# Patient Record
Sex: Female | Born: 1960 | Race: White | Hispanic: No | Marital: Married | Smoking: Never smoker
Health system: Southern US, Community
[De-identification: ages and names within clinical notes are randomized; demographics above are authoritative.]

## PROBLEM LIST (undated history)

## (undated) DIAGNOSIS — C50419 Malignant neoplasm of upper-outer quadrant of unspecified female breast: Secondary | ICD-10-CM

## (undated) DIAGNOSIS — I10 Essential (primary) hypertension: Secondary | ICD-10-CM

## (undated) DIAGNOSIS — Z923 Personal history of irradiation: Secondary | ICD-10-CM

## (undated) DIAGNOSIS — R920 Mammographic microcalcification found on diagnostic imaging of breast: Secondary | ICD-10-CM

## (undated) DIAGNOSIS — C50919 Malignant neoplasm of unspecified site of unspecified female breast: Secondary | ICD-10-CM

## (undated) HISTORY — PX: APPENDECTOMY: SHX54

## (undated) HISTORY — DX: Mammographic microcalcification found on diagnostic imaging of breast: R92.0

## (undated) HISTORY — DX: Malignant neoplasm of upper-outer quadrant of unspecified female breast: C50.419

## (undated) HISTORY — DX: Essential (primary) hypertension: I10

---

## 2006-10-03 ENCOUNTER — Ambulatory Visit: Payer: Self-pay | Admitting: Unknown Physician Specialty

## 2007-07-24 ENCOUNTER — Ambulatory Visit: Payer: Self-pay

## 2010-01-19 ENCOUNTER — Ambulatory Visit: Payer: Self-pay | Admitting: Nurse Practitioner

## 2010-12-12 DIAGNOSIS — I10 Essential (primary) hypertension: Secondary | ICD-10-CM

## 2010-12-12 HISTORY — DX: Essential (primary) hypertension: I10

## 2012-12-12 DIAGNOSIS — C50919 Malignant neoplasm of unspecified site of unspecified female breast: Secondary | ICD-10-CM

## 2012-12-12 HISTORY — PX: BREAST LUMPECTOMY: SHX2

## 2012-12-12 HISTORY — DX: Malignant neoplasm of unspecified site of unspecified female breast: C50.919

## 2012-12-14 ENCOUNTER — Ambulatory Visit: Payer: Self-pay | Admitting: Family Medicine

## 2012-12-19 ENCOUNTER — Ambulatory Visit: Payer: Self-pay | Admitting: Family Medicine

## 2012-12-31 DIAGNOSIS — R920 Mammographic microcalcification found on diagnostic imaging of breast: Secondary | ICD-10-CM

## 2012-12-31 HISTORY — DX: Mammographic microcalcification found on diagnostic imaging of breast: R92.0

## 2013-01-03 ENCOUNTER — Ambulatory Visit: Payer: Self-pay | Admitting: General Surgery

## 2013-01-03 HISTORY — PX: BREAST BIOPSY: SHX20

## 2013-01-08 ENCOUNTER — Ambulatory Visit: Payer: Self-pay | Admitting: General Surgery

## 2013-01-08 DIAGNOSIS — I10 Essential (primary) hypertension: Secondary | ICD-10-CM

## 2013-01-08 LAB — BASIC METABOLIC PANEL
Anion Gap: 7 (ref 7–16)
BUN: 15 mg/dL (ref 7–18)
Calcium, Total: 9.5 mg/dL (ref 8.5–10.1)
Chloride: 106 mmol/L (ref 98–107)
EGFR (Non-African Amer.): >60
Glucose: 88 mg/dL (ref 65–99)
Osmolality: 280 (ref 275–301)
Potassium: 3.8 mmol/L (ref 3.5–5.1)

## 2013-01-08 LAB — PREGNANCY, URINE: Pregnancy Test, Urine: NEGATIVE m[IU]/mL

## 2013-01-09 LAB — PATHOLOGY REPORT

## 2013-01-10 ENCOUNTER — Ambulatory Visit: Payer: Self-pay | Admitting: General Surgery

## 2013-01-10 DIAGNOSIS — C50419 Malignant neoplasm of upper-outer quadrant of unspecified female breast: Secondary | ICD-10-CM

## 2013-01-10 HISTORY — PX: BREAST SURGERY: SHX581

## 2013-01-10 HISTORY — DX: Malignant neoplasm of upper-outer quadrant of unspecified female breast: C50.419

## 2013-01-15 HISTORY — PX: BREAST EXCISIONAL BIOPSY: SUR124

## 2013-01-15 LAB — PATHOLOGY REPORT

## 2013-01-25 ENCOUNTER — Ambulatory Visit: Payer: Self-pay | Admitting: Radiation Oncology

## 2013-02-09 ENCOUNTER — Ambulatory Visit: Payer: Self-pay | Admitting: Radiation Oncology

## 2013-03-11 ENCOUNTER — Encounter: Payer: Self-pay | Admitting: *Deleted

## 2013-03-11 DIAGNOSIS — R920 Mammographic microcalcification found on diagnostic imaging of breast: Secondary | ICD-10-CM | POA: Insufficient documentation

## 2013-03-11 DIAGNOSIS — C50419 Malignant neoplasm of upper-outer quadrant of unspecified female breast: Secondary | ICD-10-CM | POA: Insufficient documentation

## 2013-03-12 ENCOUNTER — Ambulatory Visit: Payer: Self-pay | Admitting: Radiation Oncology

## 2013-03-20 ENCOUNTER — Ambulatory Visit (INDEPENDENT_AMBULATORY_CARE_PROVIDER_SITE_OTHER): Payer: 59 | Admitting: General Surgery

## 2013-03-20 ENCOUNTER — Encounter: Payer: Self-pay | Admitting: General Surgery

## 2013-03-20 VITALS — BP 124/70 | HR 80 | Resp 14 | Ht 66.0 in | Wt 223.0 lb

## 2013-03-20 DIAGNOSIS — C50419 Malignant neoplasm of upper-outer quadrant of unspecified female breast: Secondary | ICD-10-CM

## 2013-03-20 DIAGNOSIS — C50411 Malignant neoplasm of upper-outer quadrant of right female breast: Secondary | ICD-10-CM

## 2013-03-20 MED ORDER — TAMOXIFEN CITRATE 20 MG PO TABS: 20.0000 mg | ORAL_TABLET | Freq: Every day | ORAL | Status: DC

## 2013-03-20 NOTE — Progress Notes (Signed)
Patient ID: Alyssa Barr, female   DOB: 1961-12-07, 52 y.o.   MRN: [id number]  Chief Complaint  Patient presents with  . Routine Post Op    right lumpectomy  . Breast Cancer Long Term Follow Up    HPI Alyssa Barr is a 52 y.o. female  Here today for her follow up right lumpectomy. HPI  Past Medical History  Diagnosis Date  . Hypertension 2012  . Mammographic microcalcification 12/31/2012    right breast  . Malignant neoplasm of upper-outer quadrant of female breast 2014    right    Past Surgical History  Procedure Laterality Date  . Breast surgery Right 01/10/2013    wide excision, low grade DCIS, ER/PR positive tumor. Post procedure radiation therapy to start 02/11/13  . Breast biopsy Right 01/03/2013    stereo, low grade DCIS  . Cesarean section  [id number]  . Appendectomy      age of 47   . Exploratory laparotomy      Family History  Problem Relation Age of Onset  . Cancer Maternal Grandmother 80    Blastomycosis of the jaw-late 80's  . Cancer Paternal Grandfather 75    colon cancer in late 59's  . Cancer Cousin 50    breast cancer in late 3's    Social History History  Substance Use Topics  . Smoking status: Never Smoker   . Smokeless tobacco: Never Used  . Alcohol Use: 0.0 oz/week     Comment: ocassionally less than 1 beer weekly    Allergies  Allergen Reactions  . Sulfa Antibiotics Itching    Current Outpatient Prescriptions  Medication Sig Dispense Refill  . B Complex-C (SUPER B COMPLEX/VITAMIN C) TABS Take 1 tablet by mouth daily.      . Black Cohosh 200 MG CAPS Take 1 capsule by mouth 2 (two) times daily.      . Calcium Carbonate-Vitamin D (CALCIUM PLUS VITAMIN D PO) Take 1 tablet by mouth 2 (two) times daily.      . Cholecalciferol (VITAMIN D3) 1000 UNITS CAPS Take 1 capsule by mouth daily.      Marland Kitchen lisinopril-hydrochlorothiazide (PRINZIDE,ZESTORETIC) 20-25 MG per tablet Take 1 tablet by mouth daily.      . niacin 500 MG tablet Take 1,000 mg  by mouth daily.      . Omega-3 Fatty Acids (FISH OIL) 1200 MG CAPS Take 2,400 mg by mouth daily.      . Psyllium (DIETARY FIBER LAXATIVE PO) Take 2 tablets by mouth daily.      . Red Yeast Rice 600 MG TABS Take 2 tablets by mouth daily.       No current facility-administered medications for this visit.    Review of Systems Review of Systems  Constitutional: Negative.   Respiratory: Negative.   Cardiovascular: Negative.     Blood pressure 124/70, pulse 80, resp. rate 14, height 5\' 6"  (1.676 m), weight 223 lb (101.152 kg), last menstrual period 11/30/2012.  Physical Exam Physical Exam  Constitutional: She appears well-developed and well-nourished.  Pulmonary/Chest: Right breast exhibits no inverted nipple, no mass, no nipple discharge, no skin change and no tenderness.  Moderate redness over the treatment field.   Data Reviewed None  Assessment    DCIS right breast.     Plan    Reviewed indications for Tamoxifen treatment. Risks: DVT, vasomotor symptoms and uterine cancer discussed.  Patient will start May 1 after RT completed later this month. To give a phone f/u on  June 1 to report her tolerance of the medication.  Follow up July 2014 with right diagnostic mammogram.       Earline Mayotte 03/20/2013, 9:24 AM

## 2013-03-20 NOTE — Patient Instructions (Addendum)
Talked about tamoxifen with patient.Patient to start taking in on May 1,2014

## 2013-04-11 ENCOUNTER — Ambulatory Visit: Payer: Self-pay | Admitting: Radiation Oncology

## 2013-04-23 ENCOUNTER — Encounter: Payer: Self-pay | Admitting: General Surgery

## 2013-05-12 ENCOUNTER — Ambulatory Visit: Payer: Self-pay | Admitting: Radiation Oncology

## 2013-05-14 ENCOUNTER — Telehealth: Payer: Self-pay | Admitting: *Deleted

## 2013-05-14 NOTE — Telephone Encounter (Signed)
Phone call from patient states she has noticed some bruises on her abdomen (4) and a few on her legs. States "[place] bruise easy anyway[place].  Wonders if this is a side effect of Tamoxifen.  Reviewed side effects with the patient states she will keep an eye on them and see if anymore develop or changes occur, she will let us know.

## 2013-05-26 IMAGING — CT CT SIM MISC
1 series · 16 of 34 positions shown, 20 images · non-contrast
Comparison: none

[Series 2: — · axial · 1.17mm/px · z∈[-702,-369]mm · 16 of 114 slices shown, 20 images]
[im 9/114  mediastinal]
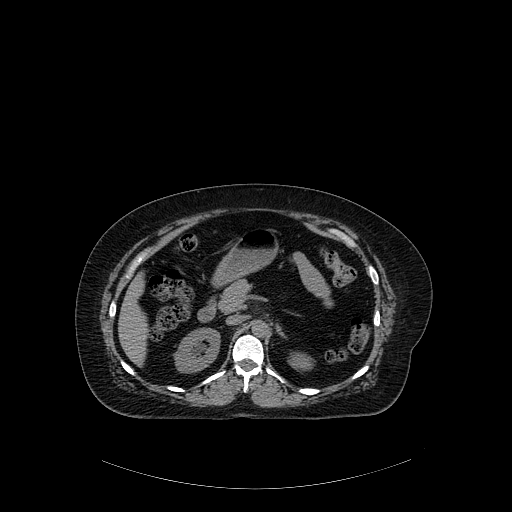
[im 9/114  lung]
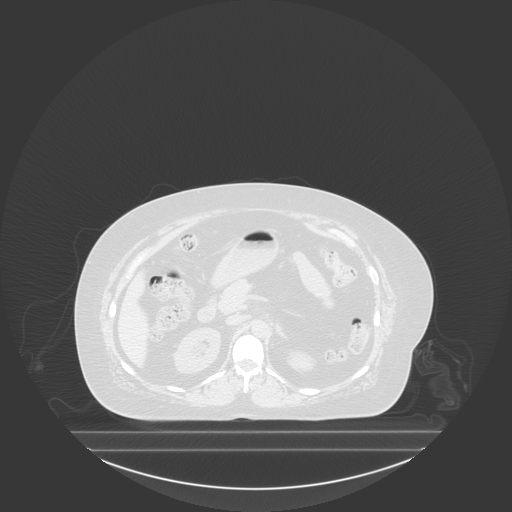
[im 17/114  lung]
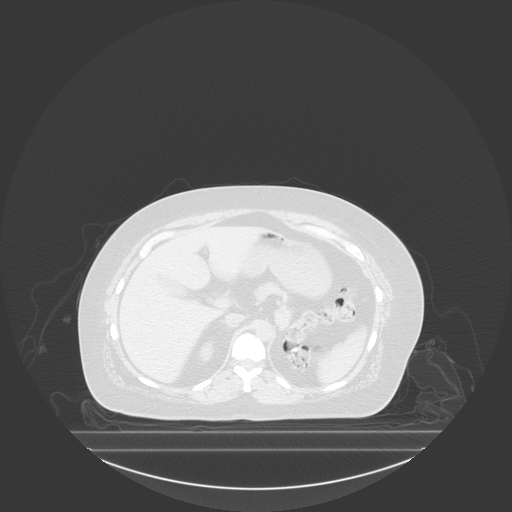
[im 23/114  lung]
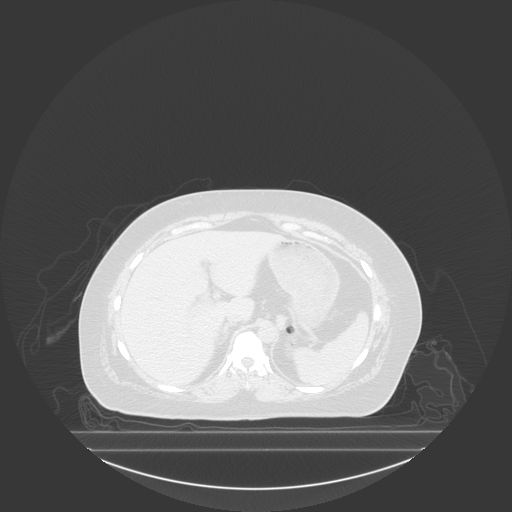
[im 30/114  lung]
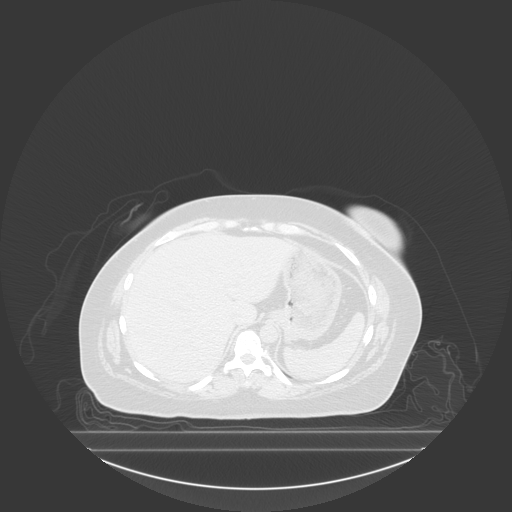
[im 38/114  mediastinal]
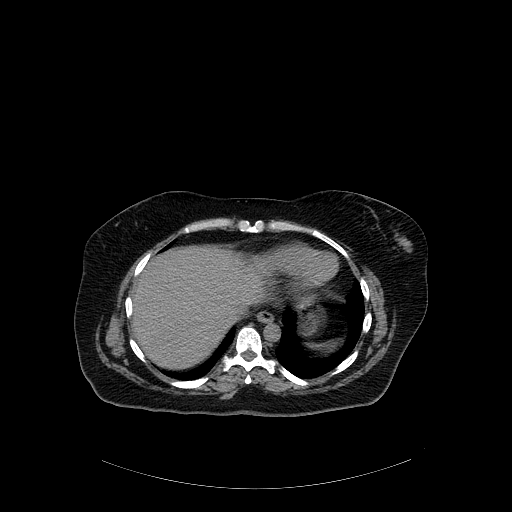
[im 38/114  lung]
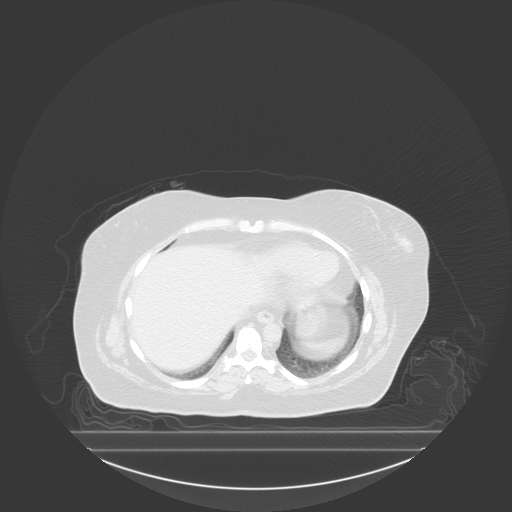
[im 46/114  lung]
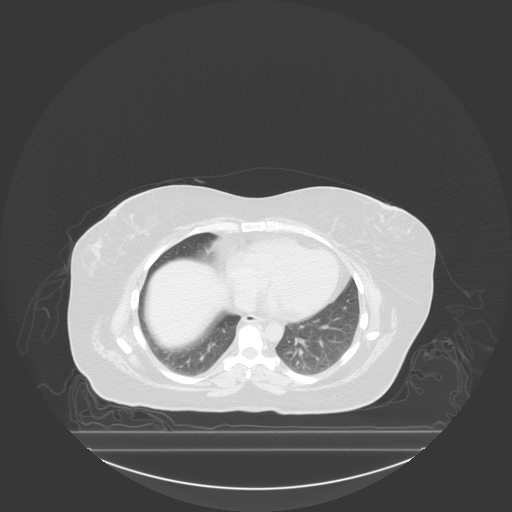
[im 51/114  lung]
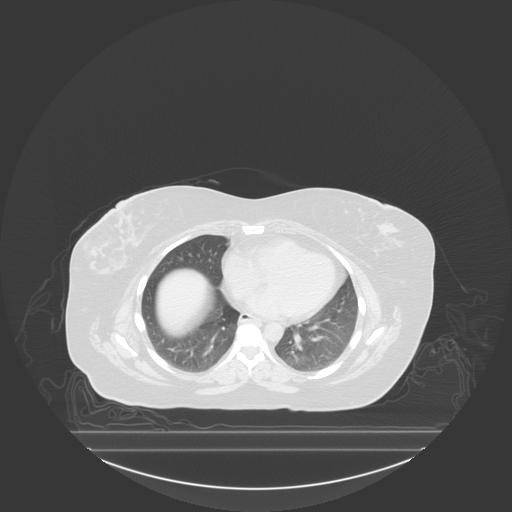
[im 59/114  lung]
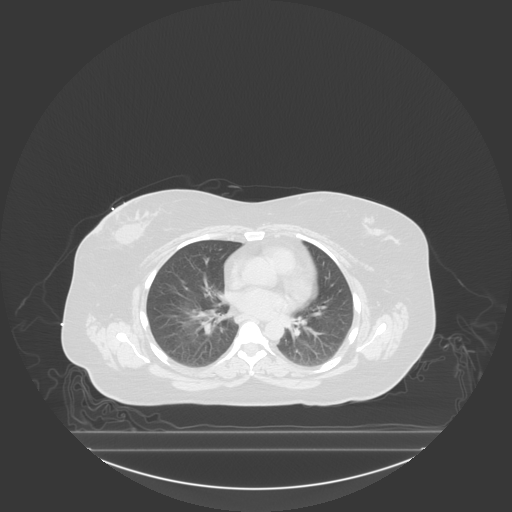
[im 63/114  mediastinal]
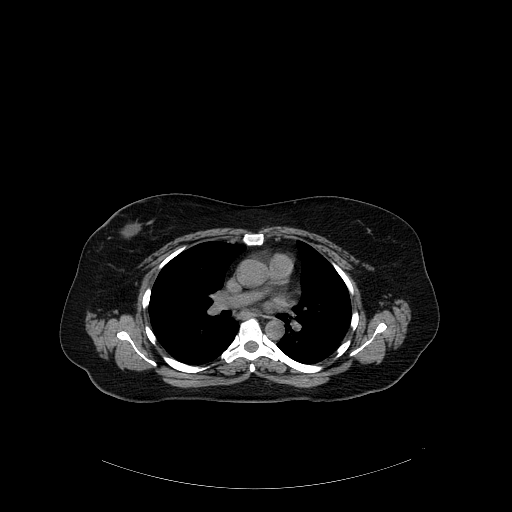
[im 63/114  lung]
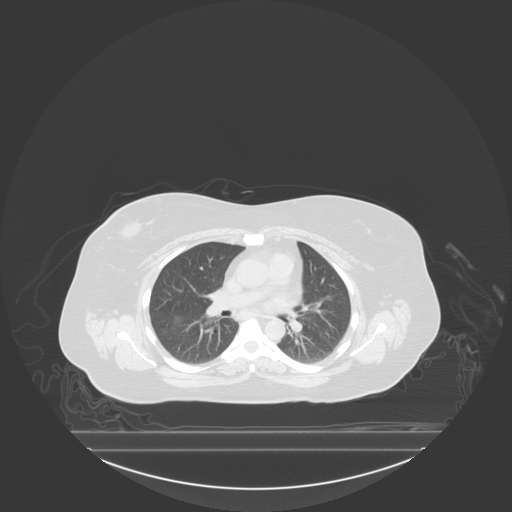
[im 67/114  lung]
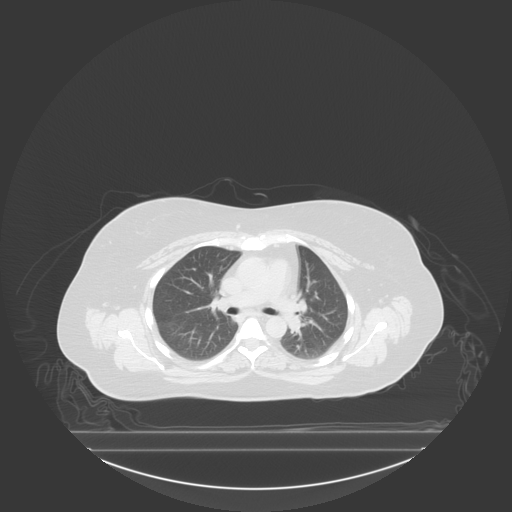
[im 72/114  lung]
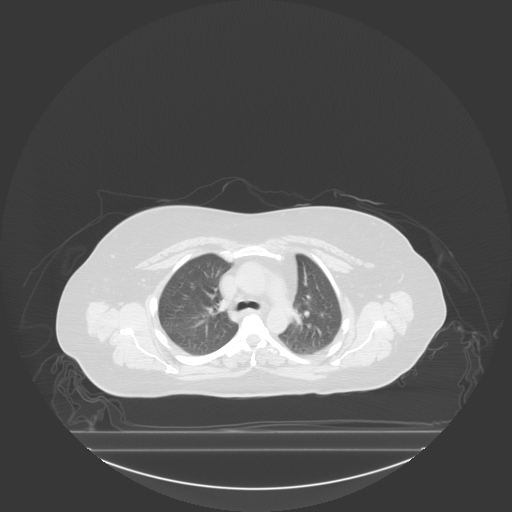
[im 80/114  lung]
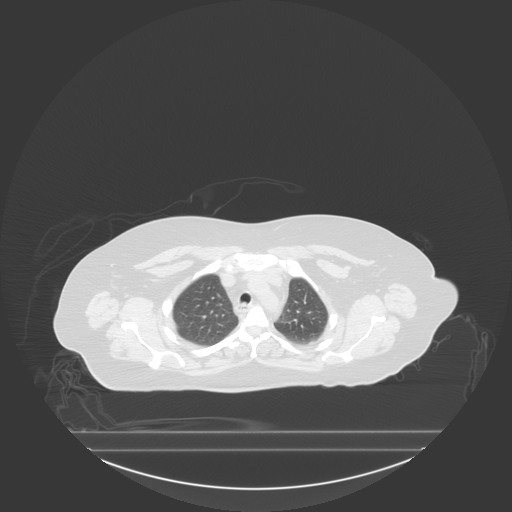
[im 88/114  mediastinal]
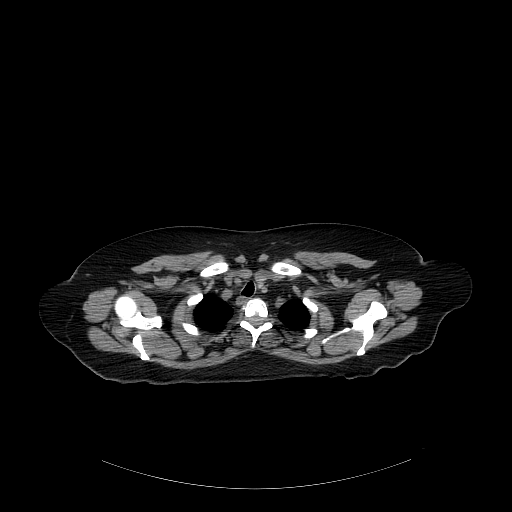
[im 88/114  lung]
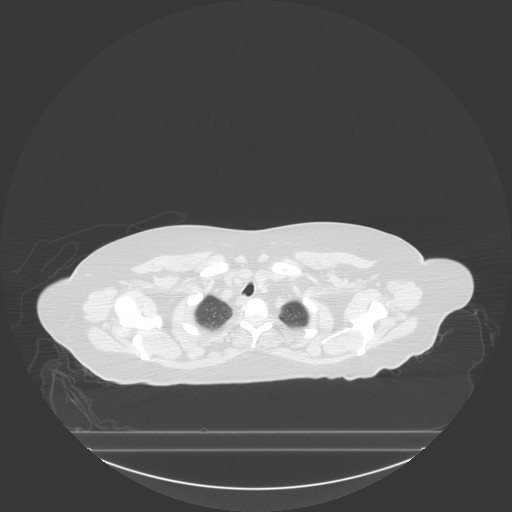
[im 93/114  lung]
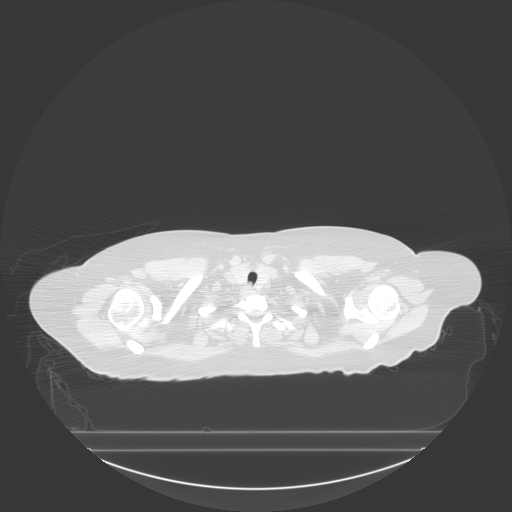
[im 101/114  lung]
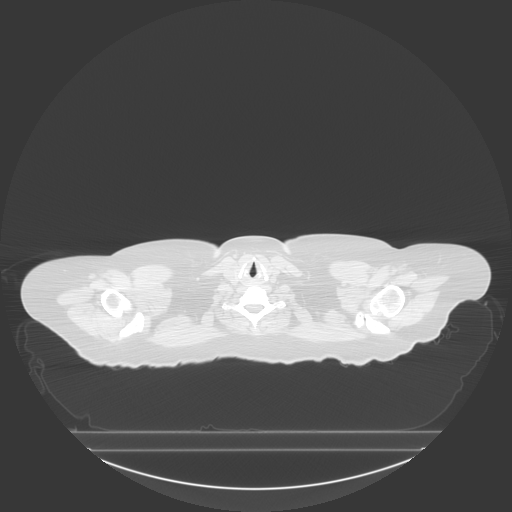
[im 109/114  lung]
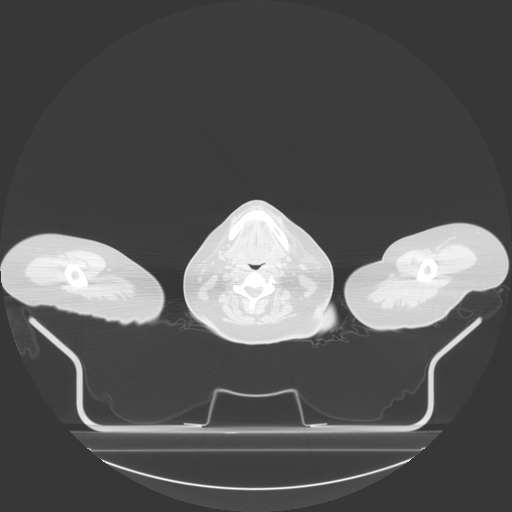

[16 of 34 positions shown; findings below may reference images not displayed]

IMAGES IMPORTED FROM THE SYN[employer] WORKFLOW SYSTEM
NO DICTATION FOR STUDY

## 2013-06-20 ENCOUNTER — Ambulatory Visit: Payer: Self-pay | Admitting: General Surgery

## 2013-06-26 ENCOUNTER — Ambulatory Visit: Payer: Self-pay | Admitting: Radiation Oncology

## 2013-06-26 ENCOUNTER — Ambulatory Visit: Payer: Self-pay | Admitting: General Surgery

## 2013-07-02 ENCOUNTER — Ambulatory Visit: Payer: Self-pay | Admitting: General Surgery

## 2013-07-03 ENCOUNTER — Encounter: Payer: Self-pay | Admitting: General Surgery

## 2013-07-10 ENCOUNTER — Encounter: Payer: Self-pay | Admitting: General Surgery

## 2013-07-10 ENCOUNTER — Ambulatory Visit (INDEPENDENT_AMBULATORY_CARE_PROVIDER_SITE_OTHER): Payer: 59 | Admitting: General Surgery

## 2013-07-10 ENCOUNTER — Other Ambulatory Visit: Payer: Self-pay | Admitting: General Surgery

## 2013-07-10 VITALS — BP 130/78 | HR 74 | Resp 14 | Ht 66.0 in | Wt 222.0 lb

## 2013-07-10 DIAGNOSIS — C50419 Malignant neoplasm of upper-outer quadrant of unspecified female breast: Secondary | ICD-10-CM

## 2013-07-10 DIAGNOSIS — Z1211 Encounter for screening for malignant neoplasm of colon: Secondary | ICD-10-CM

## 2013-07-10 DIAGNOSIS — C50411 Malignant neoplasm of upper-outer quadrant of right female breast: Secondary | ICD-10-CM

## 2013-07-10 MED ORDER — POLYETHYLENE GLYCOL 3350 17 GM/SCOOP PO POWD: ORAL | Status: DC

## 2013-07-10 NOTE — Patient Instructions (Addendum)
Patient to return in six months bilateral diagnotic mammogram. Colonoscopy A colonoscopy is an exam to evaluate your entire colon. In this exam, your colon is cleansed. A long fiberoptic tube is inserted through your rectum and into your colon. The fiberoptic scope (endoscope) is a long bundle of enclosed and very flexible fibers. These fibers transmit light to the area examined and send images from that area to your caregiver. Discomfort is usually minimal. You may be given a drug to help you sleep (sedative) during or prior to the procedure. This exam helps to detect lumps (tumors), polyps, inflammation, and areas of bleeding. Your caregiver may also take a small piece of tissue (biopsy) that will be examined under a microscope. LET YOUR CAREGIVER KNOW ABOUT:   Allergies to food or medicine.  Medicines taken, including vitamins, herbs, eyedrops, over-the-counter medicines, and creams.  Use of steroids (by mouth or creams).  Previous problems with anesthetics or numbing medicines.  History of bleeding problems or blood clots.  Previous surgery.  Other health problems, including diabetes and kidney problems.  Possibility of pregnancy, if this applies. BEFORE THE PROCEDURE   A clear liquid diet may be required for 2 days before the exam.  Ask your caregiver about changing or stopping your regular medications.  Liquid injections (enemas) or laxatives may be required.  A large amount of electrolyte solution may be given to you to drink over a short period of time. This solution is used to clean out your colon.  You should be present 60 minutes prior to your procedure or as directed by your caregiver. AFTER THE PROCEDURE   If you received a sedative or pain relieving medication, you will need to arrange for someone to drive you home.  Occasionally, there is a little blood passed with the first bowel movement. Do not be concerned. FINDING OUT THE RESULTS OF YOUR TEST Not all test  results are available during your visit. If your test results are not back during the visit, make an appointment with your caregiver to find out the results. Do not assume everything is normal if you have not heard from your caregiver or the medical facility. It is important for you to follow up on all of your test results. HOME CARE INSTRUCTIONS   It is not unusual to pass moderate amounts of gas and experience mild abdominal cramping following the procedure. This is due to air being used to inflate your colon during the exam. Walking or a warm pack on your belly (abdomen) may help.  You may resume all normal meals and activities after sedatives and medicines have worn off.  Only take over-the-counter or prescription medicines for pain, discomfort, or fever as directed by your caregiver. Do not use aspirin or blood thinners if a biopsy was taken. Consult your caregiver for medicine usage if biopsies were taken. SEEK IMMEDIATE MEDICAL CARE IF:   You have a fever.  You pass large blood clots or fill a toilet with blood following the procedure. This may also occur 10 to 14 days following the procedure. This is more likely if a biopsy was taken.  You develop abdominal pain that keeps getting worse and cannot be relieved with medicine. Document Released: 11/25/2000 Document Revised: 02/20/2012 Document Reviewed: 07/10/2008 [facility] Patient Information 2014 [place], [place].  Patient has been scheduled for a colonoscopy on 09-03-13 at [facility]. This patient has been asked to discontinue fish oil one week prior to procedure.

## 2013-07-10 NOTE — Progress Notes (Signed)
Patient ID: Alyssa Barr, female   DOB: 26-Feb-1961, 52 y.o.   MRN: [id number]  Chief Complaint  Patient presents with  . Follow-up    mammogram    HPI Alyssa Barr is a 52 y.o. female who presents for a breast evaluation. The most recent mammogram was done on 06/02/13 cat 2. The patient underwent excision of an area of DCIS in January 2014. Nuclear grade 1.   Patient does perform regular self breast checks and gets regular mammograms done.    The patient will be 52 later this year, and brought up the indication for colonoscopy at that time.  HPI  Past Medical History  Diagnosis Date  . Hypertension 2012  . Mammographic microcalcification 12/31/2012    right breast  . Malignant neoplasm of upper-outer quadrant of female breast 2014    right    Past Surgical History  Procedure Laterality Date  . Breast surgery Right 01/10/2013    wide excision, low grade DCIS, ER/PR positive tumor. Post procedure radiation therapy to start 02/11/13  . Breast biopsy Right 01/03/2013    stereo, low grade DCIS  . Cesarean section  [id number]  . Appendectomy      age of 81     Family History  Problem Relation Age of Onset  . Cancer Maternal Grandmother 80    Blastomycosis of the jaw-late 80's  . Cancer Paternal Grandfather 64    colon cancer in late 40's  . Cancer Cousin 50    breast cancer in late 58's    Social History History  Substance Use Topics  . Smoking status: Never Smoker   . Smokeless tobacco: Never Used  . Alcohol Use: 0.0 oz/week     Comment: ocassionally less than 1 beer weekly    Allergies  Allergen Reactions  . Sulfa Antibiotics Itching    Current Outpatient Prescriptions  Medication Sig Dispense Refill  . B Complex-C (SUPER B COMPLEX/VITAMIN C) TABS Take 1 tablet by mouth daily.      . Calcium Carbonate-Vitamin D (CALCIUM PLUS VITAMIN D PO) Take 1 tablet by mouth 2 (two) times daily.      . Cholecalciferol (VITAMIN D3) 1000 UNITS CAPS Take 1 capsule by  mouth daily.      Marland Kitchen lisinopril-hydrochlorothiazide (PRINZIDE,ZESTORETIC) 20-25 MG per tablet Take 1 tablet by mouth daily.      . niacin 500 MG tablet Take 1,000 mg by mouth daily.      . Omega-3 Fatty Acids (FISH OIL) 1200 MG CAPS Take 2,400 mg by mouth daily.      . Psyllium (DIETARY FIBER LAXATIVE PO) Take 2 tablets by mouth daily.      . Red Yeast Rice 600 MG TABS Take 2 tablets by mouth daily.      . tamoxifen (NOLVADEX) 20 MG tablet Take 1 tablet (20 mg total) by mouth daily.  30 tablet  11  . polyethylene glycol powder (GLYCOLAX/MIRALAX) powder 255 grams one bottle for colonoscopy prep  255 g  0   No current facility-administered medications for this visit.    Review of Systems Review of Systems  Constitutional: Negative.   Respiratory: Negative.   Cardiovascular: Negative.     Blood pressure 130/78, pulse 74, resp. rate 14, height 5\' 6"  (1.676 m), weight 222 lb (100.699 kg).  Physical Exam Physical Exam  Constitutional: She is oriented to person, place, and time. She appears well-developed and well-nourished.  Cardiovascular: Normal rate, regular rhythm and normal heart sounds.   Pulmonary/Chest:  Breath sounds normal. Right breast exhibits no inverted nipple, no mass, no nipple discharge, no skin change and no tenderness. Left breast exhibits no inverted nipple, no mass, no nipple discharge, no skin change and no tenderness.  faint redness over the right lumpectomy site. Well healed scar at 12 o'clock.  Lymphadenopathy:    She has no cervical adenopathy.    She has no axillary adenopathy.  Neurological: She is alert and oriented to person, place, and time.  Skin: Skin is warm and dry.    Data Reviewed Right breast mammogram dated July 02, 2013 shows no residual microcalcifications status post wide excision. BI-RAD-2.  Assessment    DCIS right breast, tolerating tamoxifen therapy fairly well.     Plan    Bilateral diagnostic mammograms will be obtained in 6  months.  Arrangements were made for a colonoscopy later this fall.     Patient has been scheduled for a colonoscopy on 09-03-13 at [facility]. This patient has been asked to discontinue fish oil one week prior to procedure.   Alyssa Barr 07/10/2013, 9:45 PM

## 2013-07-12 ENCOUNTER — Ambulatory Visit: Payer: Self-pay | Admitting: Radiation Oncology

## 2013-07-24 ENCOUNTER — Emergency Department: Payer: Self-pay | Admitting: Unknown Physician Specialty

## 2013-09-02 ENCOUNTER — Telehealth: Payer: Self-pay | Admitting: *Deleted

## 2013-09-02 NOTE — Telephone Encounter (Signed)
Message for patient to call the office. She is scheduled for a colonoscopy tomorrow, 09-03-13.

## 2013-09-02 NOTE — Telephone Encounter (Signed)
Patient reports she is on the same medications since last office visit. We will proceed with colonoscopy that is scheduled for 09-03-13 at [facility].

## 2013-09-03 ENCOUNTER — Ambulatory Visit: Payer: Self-pay | Admitting: General Surgery

## 2013-09-03 DIAGNOSIS — Z1211 Encounter for screening for malignant neoplasm of colon: Secondary | ICD-10-CM

## 2013-09-03 HISTORY — PX: COLONOSCOPY: SHX174

## 2013-09-04 ENCOUNTER — Encounter: Payer: Self-pay | Admitting: General Surgery

## 2013-09-18 ENCOUNTER — Encounter: Payer: Self-pay | Admitting: General Surgery

## 2013-10-02 ENCOUNTER — Ambulatory Visit: Payer: Self-pay | Admitting: Radiation Oncology

## 2013-10-12 ENCOUNTER — Ambulatory Visit: Payer: Self-pay | Admitting: Radiation Oncology

## 2013-12-24 ENCOUNTER — Telehealth: Payer: Self-pay

## 2013-12-24 NOTE — Telephone Encounter (Signed)
Will arrange for pelvic ultrasound for assessment of endometrial lining. F/u after exam.

## 2013-12-24 NOTE — Telephone Encounter (Signed)
I.Spoke with the patient regarding her reported vaginal bleeding.We'll arrange for a Vaginal ultrasound to assess endometrial lining thickening.  The patient has to be contacted on her cell.

## 2013-12-24 NOTE — Telephone Encounter (Signed)
Has not had a period since 11/2012, was started on Tamoxifen in May 2014. She started bleeding today and was concerned if this was normal or not.

## 2013-12-25 ENCOUNTER — Ambulatory Visit: Payer: Self-pay | Admitting: General Surgery

## 2013-12-25 ENCOUNTER — Encounter: Payer: Self-pay | Admitting: General Surgery

## 2013-12-25 NOTE — Telephone Encounter (Signed)
Patient has been scheduled for a vaginal ultrasound at [facility] for 12-27-13 at 8:30 am (arrive 8:15 am). Prep: drink 32 oz. water one hour prior to exam, must be finished with liquid 30 minutes prior, and do not void. She is aware of all instructions.  Patient will follow up in the office as scheduled.

## 2013-12-27 ENCOUNTER — Ambulatory Visit: Payer: Self-pay | Admitting: General Surgery

## 2013-12-27 ENCOUNTER — Encounter: Payer: Self-pay | Admitting: General Surgery

## 2013-12-30 ENCOUNTER — Telehealth: Payer: Self-pay

## 2013-12-30 ENCOUNTER — Other Ambulatory Visit: Payer: Self-pay

## 2013-12-30 DIAGNOSIS — N939 Abnormal uterine and vaginal bleeding, unspecified: Secondary | ICD-10-CM

## 2013-12-30 NOTE — Telephone Encounter (Signed)
Spoke with patient and reviewed results. She is scheduled to see Dr Kenton Kingfisher at [place] at the [facility] center in [place] on 01/07/14 at 9:20 am. Patient is aware of date and time. She will call back with any questions. All records have been faxed to [facility].

## 2013-12-30 NOTE — Telephone Encounter (Signed)
Message copied by Lesly Rubenstein on Mon Dec 30, 2013  2:55 PM ------      Message from: Rupert, [place] W      Created: Mon Dec 30, 2013 12:28 PM       Please notify the patient that the vaginal ultrasound did show some thickening of the lining of the uterus.  It will be appropriate for her to be seen by GYN and have a biopsy. Stop Tamoxifen for the present.      See who she would like to see, if no preference, arrange for early referral to [facility]. Thanks.      ----- Message -----         From: Darrin Nipper, CMA         Sent: 12/27/2013   9:48 AM           To: Robert Bellow, MD                   ------

## 2014-01-07 HISTORY — PX: ENDOMETRIAL BIOPSY: SHX622

## 2014-01-08 ENCOUNTER — Encounter: Payer: Self-pay | Admitting: General Surgery

## 2014-01-08 ENCOUNTER — Ambulatory Visit (INDEPENDENT_AMBULATORY_CARE_PROVIDER_SITE_OTHER): Payer: 59 | Admitting: General Surgery

## 2014-01-08 VITALS — BP 154/80 | HR 82 | Resp 12 | Ht 66.0 in | Wt 234.0 lb

## 2014-01-08 DIAGNOSIS — D0510 Intraductal carcinoma in situ of unspecified breast: Secondary | ICD-10-CM

## 2014-01-08 DIAGNOSIS — D0590 Unspecified type of carcinoma in situ of unspecified breast: Secondary | ICD-10-CM

## 2014-01-08 NOTE — Progress Notes (Signed)
Patient ID: Percell Barr, female   DOB: 14-Mar-1961, 53 y.o.   MRN: [id number]  Chief Complaint  Patient presents with  . Follow-up    mammogram    HPI Alyssa Barr is a 53 y.o. female who presents for a breast evaluation. The most recent mammogram was done on 12/30/13.Patient does perform regular self breast checks and gets regular mammograms done. Patient had an vaginal ultrasound and biospy at [facility] on 01/07/14 completed for postmenopausal bleeding and an ultrasound showing endometrial thickening at 13 mm. Patient has stop taking her Tamoxifen pending her GYN evaluation.Marland Kitchen    HPI  Past Medical History  Diagnosis Date  . Hypertension 2012  . Mammographic microcalcification 12/31/2012    right breast  . 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 Breast surgery Right 01/10/2013    wide excision, low grade DCIS, ER/PR positive tumor. Post procedure radiation therapy to start 02/11/13  . Breast biopsy Right 01/03/2013    stereo, low grade DCIS  . Cesarean section  [id number]  . Appendectomy      age of 70   . Endometrial biopsy  01/07/2014    Alyssa Barr, M.D. for endometrial prominence and postmenopausal bleeding.    Family History  Problem Relation Age of Onset  . Cancer Maternal Grandmother 80    Blastomycosis of the jaw-late 80's  . Cancer Paternal Grandfather 53    colon cancer in late 59's  . Cancer Cousin 50    breast cancer in late 55's    Social History History  Substance Use Topics  . Smoking status: Never Smoker   . Smokeless tobacco: Never Used  . Alcohol Use: 0.0 oz/week     Comment: ocassionally less than 1 beer weekly    Allergies  Allergen Reactions  . Sulfa Antibiotics Itching    Current Outpatient Prescriptions  Medication Sig Dispense Refill  . B Complex-C (SUPER B  COMPLEX/VITAMIN C) TABS Take 1 tablet by mouth daily.      . Calcium Carbonate-Vitamin D (CALCIUM PLUS VITAMIN D PO) Take 1 tablet by mouth 2 (two) times daily.      . Cholecalciferol (VITAMIN D3) 1000 UNITS CAPS Take 1 capsule by mouth daily.      Marland Kitchen lisinopril-hydrochlorothiazide (PRINZIDE,ZESTORETIC) 20-25 MG per tablet Take 1 tablet by mouth daily.      . niacin 500 MG tablet Take 1,000 mg by mouth daily.      . Omega-3 Fatty Acids (FISH OIL) 1200 MG CAPS Take 2,400 mg by mouth daily.      . polyethylene glycol powder (GLYCOLAX/MIRALAX) powder 255 grams one bottle for colonoscopy prep  255 g  0  . Psyllium (DIETARY FIBER LAXATIVE PO) Take 2 tablets by mouth daily.      . Red Yeast Rice 600 MG TABS Take 2 tablets by mouth daily.       No current facility-administered medications for this visit.    Review of Systems Review of Systems  Constitutional: Negative.   Respiratory: Negative.   Cardiovascular: Negative.     Blood pressure 154/80, pulse 82, resp. rate 12, height 5\' 6"  (1.676 m), weight 234 lb (106.142 kg).  Physical Exam Physical Exam  Constitutional: She is oriented to person, place, and time. She appears well-developed and well-nourished.  Eyes: Conjunctivae are normal.  Neck:  Neck supple.  Cardiovascular: Normal rate, regular rhythm and normal heart sounds.   Pulmonary/Chest: Effort normal and breath sounds normal. Right breast exhibits no inverted nipple, no mass, no nipple discharge, no skin change and no tenderness. Left breast exhibits no inverted nipple, no mass, no nipple discharge, no skin change and no tenderness.  Well healed incision right breast .   Lymphadenopathy:    She has no cervical adenopathy.    She has no axillary adenopathy.  Neurological: She is alert and oriented to person, place, and time.  Skin: Skin is warm and dry.    Data Reviewed Bilateral diagnostic mammograms dated December 25, 2013 were reviewed. Minimal scarring.  BI-RAD-2.  Assessment    Low-grade DCIS.  Postmenopausal bleeding with endometrial thickening on ultrasound, biopsy results pending.    Plan    The patient will hold the use of tamoxifen pending her endometrial biopsy results. It's unlikely secondary to tamoxifen therapy at this early stage. If hyperplasia is identified, will need to consider changing to [place]. If atypia is identified she'll likely be a candidate for a D&C versus TAH/BSO.  In regards to her breasts will arrange for followup diagnostic mammogram, bilateral in one year.       Robert Bellow 01/08/2014, 9:20 AM

## 2014-01-08 NOTE — Patient Instructions (Signed)
Patient to return in one year bilateral diagnotic mammogram.  

## 2014-01-11 ENCOUNTER — Encounter: Payer: Self-pay | Admitting: General Surgery

## 2014-01-11 NOTE — Progress Notes (Signed)
Patient ID: Alyssa Barr, female   DOB: [birth year], 53 y.o.   MRN: [id number]   The patient underwent endometrial biopsy by Barnett Applebaum, M.D. after she developed vaginal bleeding one year at the initiation of tamoxifen therapy.  Pathology showed a benign endometrial polyp and proliferative phase endometrium. No evidence of hyperplasia or malignancy. Benign endocervical tissue. This does not represent a contraindication to continue tamoxifen therapy.  A prescription for tamoxifen, 20 mg daily will be sent to her mail-order pharmacy.  Follow up as previously scheduled.

## 2014-01-14 ENCOUNTER — Telehealth: Payer: Self-pay

## 2014-01-14 ENCOUNTER — Other Ambulatory Visit: Payer: Self-pay

## 2014-01-14 DIAGNOSIS — D0510 Intraductal carcinoma in situ of unspecified breast: Secondary | ICD-10-CM

## 2014-01-14 MED ORDER — TAMOXIFEN CITRATE 20 MG PO TABS: 20.0000 mg | ORAL_TABLET | Freq: Every day | ORAL | Status: DC

## 2014-01-14 NOTE — Telephone Encounter (Signed)
Message copied by Lesly Rubenstein on Tue Jan 14, 2014 11:16 AM ------      Message from: Robert Bellow      Created: Sat Jan 11, 2014  8:22 AM       Caryl-Lyn:  Mail order pharmacy is not in the system. Please arrange for a prescription for tamoxifen, 20 mg, one by mouth daily, #90 with 3 refills. [facility]. Thanks. ------

## 2014-01-14 NOTE — Telephone Encounter (Signed)
Patient notified to restart Tamoxifen. Prescription sent to [facility] for mail order delivery service.

## 2014-05-21 ENCOUNTER — Telehealth: Payer: Self-pay | Admitting: General Surgery

## 2014-05-21 NOTE — Telephone Encounter (Signed)
PLEASE CALL IN SCRIPT FOR TAMOXIFEN TO [facility]. SHE NORMALLY GETS MEDS FROM MAIL IN PHARMACY BUT THE ARE TEMPORARILY  OUT.

## 2014-05-22 MED ORDER — TAMOXIFEN CITRATE 20 MG PO TABS: 20.0000 mg | ORAL_TABLET | Freq: Every day | ORAL | Status: DC

## 2014-05-22 NOTE — Telephone Encounter (Signed)
Send RX Tamoxifen, 20 mg, # 30, 11 refills to requested pharmacy. Thank.s

## 2014-05-22 NOTE — Addendum Note (Signed)
Addended by: Carson Myrtle on: 05/22/2014 08:46 AM   Modules accepted: Orders

## 2014-05-22 NOTE — Telephone Encounter (Signed)
RX sent

## 2014-10-01 ENCOUNTER — Ambulatory Visit: Payer: Self-pay | Admitting: Radiation Oncology

## 2014-10-12 ENCOUNTER — Ambulatory Visit: Payer: Self-pay | Admitting: Radiation Oncology

## 2014-10-13 ENCOUNTER — Encounter: Payer: Self-pay | Admitting: General Surgery

## 2015-01-07 ENCOUNTER — Encounter: Payer: Self-pay | Admitting: General Surgery

## 2015-01-08 ENCOUNTER — Ambulatory Visit (INDEPENDENT_AMBULATORY_CARE_PROVIDER_SITE_OTHER): Payer: 59 | Admitting: General Surgery

## 2015-01-08 ENCOUNTER — Encounter: Payer: Self-pay | Admitting: General Surgery

## 2015-01-08 VITALS — BP 130/68 | HR 86 | Resp 14 | Ht 66.0 in | Wt 235.0 lb

## 2015-01-08 DIAGNOSIS — D0511 Intraductal carcinoma in situ of right breast: Secondary | ICD-10-CM

## 2015-01-08 NOTE — Progress Notes (Signed)
Patient ID: Alyssa Barr, female   DOB: 04-03-1961, 54 y.o.   MRN: [id number]  Chief Complaint  Patient presents with  . Follow-up    mammogram    HPI Alyssa Barr is a 54 y.o. female who presents for a breast evaluation. The most recent mammogram was done on 01/02/15 .  Patient does perform regular self breast checks and gets regular mammograms done.    The patient was concerned about her weight. She reports she walks one to 2 miles per day to brisk pace and has been careful about her diet. She is 81 pounds since her last visit here.  HPI  Past Medical History  Diagnosis Date  . Hypertension 2012  . Mammographic microcalcification 12/31/2012    right breast  . 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 Breast surgery Right 01/10/2013    wide excision, low grade DCIS, ER/PR positive tumor. Post procedure radiation therapy to start 02/11/13  . Breast biopsy Right 01/03/2013    stereo, low grade DCIS  . Cesarean section  [id number]  . Appendectomy      age of 58   . Endometrial biopsy  01/07/2014    Alyssa Barr, M.D. for endometrial prominence and postmenopausal bleeding.  . Colonoscopy  09/03/2013    Aletha Allebach    Family History  Problem Relation Age of Onset  . Cancer Maternal Grandmother 80    Blastomycosis of the jaw-late 80's  . Cancer Paternal Grandfather 63    colon cancer in late 29's  . Cancer Cousin 50    breast cancer in late 59's    Social History History  Substance Use Topics  . Smoking status: Never Smoker   . Smokeless tobacco: Never Used  . Alcohol Use: 0.0 oz/week     Comment: ocassionally less than 1 beer weekly    Allergies  Allergen Reactions  . Sulfa Antibiotics Itching    Current Outpatient Prescriptions  Medication Sig Dispense Refill  . Cholecalciferol (VITAMIN D3)  1000 UNITS CAPS Take 1 capsule by mouth daily.    Marland Kitchen lisinopril-hydrochlorothiazide (PRINZIDE,ZESTORETIC) 20-25 MG per tablet Take 1 tablet by mouth daily.    . tamoxifen (NOLVADEX) 20 MG tablet Take 1 tablet (20 mg total) by mouth daily. 90 tablet 3   No current facility-administered medications for this visit.    Review of Systems Review of Systems  Constitutional: Negative.   Respiratory: Negative.   Cardiovascular: Negative.     Blood pressure 130/68, pulse 86, resp. rate 14, height 5\' 6"  (1.676 m), weight 235 lb (106.595 kg).  Physical Exam Physical Exam  Constitutional: She is oriented to person, place, and time. She appears well-developed and well-nourished.  Eyes: Conjunctivae are normal. No scleral icterus.  Neck: Neck supple.  Cardiovascular: Normal rate, regular rhythm and normal heart sounds.   Pulmonary/Chest: Effort normal and breath sounds normal. Right breast exhibits no inverted nipple, no mass, no nipple discharge, no skin change and no tenderness. Left breast exhibits no inverted nipple, no mass, no nipple discharge, no skin change and no tenderness.    Good symmetry area  Abdominal: Soft. Normal appearance and bowel sounds are normal. There is no hepatomegaly. There is no tenderness. No hernia.  Lymphadenopathy:    She has no cervical adenopathy.    She has no axillary adenopathy.  Neurological: She is alert and oriented to person, place, and time.  Skin: Skin is warm and dry.    Data Reviewed Bilateral diagnostic mammograms completed 01/06/2015 at [facility] showed no interval change. Postsurgical scarring noted. BI-RADS-2.  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    Plan    The patient has been asked to return to the office in one year with a bilateral diagnostic mammogram.    PCP:  Virgie Dad 01/08/2015, 12:21 PM

## 2015-01-08 NOTE — Patient Instructions (Signed)
The patient has been asked to return to the office in one year with a bilateral diagnostic mammogram. 

## 2015-02-09 ENCOUNTER — Other Ambulatory Visit: Payer: Self-pay | Admitting: General Surgery

## 2015-04-03 NOTE — Op Note (Signed)
PATIENT NAME:  Alyssa Barr, Alyssa Barr MR#:  [id number] DATE OF BIRTH:  [birth year]  DATE OF PROCEDURE:  01/10/2013  PREOPERATIVE DIAGNOSIS: DCIS of the right breast.  POSTOPERATIVE DIAGNOSIS:  DCIS of the right breast.  OPERATIVE PROCEDURE: Wide local excision.   SURGEON: Hervey Ard, MD.   ANESTHESIA: General by LMA under Dr. Andree Elk, Marcaine 0.5% with 1:200,000 units epinephrine, 30 mL local infiltration.   ESTIMATED BLOOD LOSS: Minimal.   CLINICAL NOTE: This 54 year old woman had a mammogram showing evidence of a new cluster of microcalcifications. Stereotactic biopsy showed evidence of low grade DCIS. She was felt to be a candidate for wide local excision and post procedure radiation therapy for management.   OPERATIVE NOTE: With the patient under adequate general anesthesia, the area was prepped with ChloraPrep and draped. She received Kefzol prior to the procedure. Ultrasound was used to identify the previous biopsy cavity. Review of her pre-biopsy mammogram showed a secondary cluster of microcalcifications superior and medial to the original and it was elected to encompass this in the wide excision. A radial incision at the 9 o'clock location of the breast was extended from the 9 to 12 o'clock position in a circumareolar pattern to encompass an area approximately 4 x 4 x 5 cm. The skin was incised sharply and the remaining dissection completed with electrocautery. The breast flaps, approximately 1 cm in thickness, were elevated to the level of the breast parenchyma and then extended down to, but not including the pectoralis fascia. The specimen was orientated and specimen radiograph confirmed the previous biopsy clip and the secondary cluster of calcifications. It was sent fresh to pathology for margin inking.   The area showed good hemostasis. The wound was closed in multiple layers with 2-0 Vicryl figure-of-eight sutures. The skin was closed with a running 4-0 Vicryl subcuticular suture.  Benzoin and Steri-Strips were applied followed by Telfa pad. Fluff gauze, Kerlix, and Ace wrap was then applied. The patient tolerated the procedure well and was taken to the recovery room in stable condition.  ____________________________ Alyssa Bellow, MD jwb:aw D: 01/10/2013 08:55:39 ET T: 01/10/2013 10:25:06 ET JOB#: [id number]  cc: Alyssa Bellow, MD, <Dictator> Irven Easterly. Kary Kos, MD Stylianos Stradling Amedeo Kinsman MD ELECTRONICALLY SIGNED 01/10/2013 20:25

## 2015-04-03 NOTE — Consult Note (Signed)
Reason for Visit: This 54 year old Female patient presents to the clinic for initial evaluation of  breast cancer .   Referred by Dr. Hervey Ard.  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 Tollie Pizza. Pathology was positive for ductal carcinoma in situ. Underwent wide local excision for a 2 cm mass of DCIS strongly ER/PR positive. It was micropapillary type grade 1. Margins were clear but close at 1 mm. Patient tolerated her surgery well. She seen today and doing fine with no complaints. I been asked to evaluate the patient for possible adjuvant radiation therapy to her right breast.  Past, Family and Social History:  Past Medical History positive   Family History positive   Family History Comments family history positive for colon cancer, breast cancer,adult onset diabetes and hypertension.   Social History positive   Social History Comments no smoking history social EtOH use history.   Additional Past Medical and Surgical History seen by herself today.   Allergies:   Sulfa drugs: Swelling, Itching  Home Meds:  Home Medications: Medication Instructions Status  hydrochlorothiazide-lisinopril 25 mg-20 mg oral tablet 1 tab(s) orally once a day Active   Review of Systems:  General negative   Performance Status (ECOG) 0   Skin negative   Breast see HPI   Ophthalmologic  negative   ENMT negative   Respiratory and Thorax negative   Cardiovascular negative   Gastrointestinal negative   Genitourinary negative   Musculoskeletal negative   Neurological negative   Psychiatric negative   Hematology/Lymphatics negative   Endocrine negative   Allergic/Immunologic negative   Review of Systems   according to the nurse's notesPatient denies any weight loss, fatigue, weakness, fever, chills or night sweats. Patient denies any loss of vision, blurred vision. Patient denies any ringing  of the ears or hearing loss. No irregular heartbeat. Patient denies heart murmur or history of fainting. Patient denies any chest pain or pain radiating to her upper extremities. Patient denies any shortness of breath, difficulty breathing at night, cough or hemoptysis. Patient denies any swelling in the lower legs. Patient denies any nausea vomiting, vomiting of blood, or coffee ground material in the vomitus. Patient denies any stomach pain. Patient states has had normal bowel movements no significant constipation or diarrhea. Patient denies any dysuria, hematuria or significant nocturia. Patient denies any problems walking, swelling in the joints or loss of balance. Patient denies any skin changes, loss of hair or loss of weight. Patient denies any excessive worrying or anxiety or significant depression. Patient denies any problems with insomnia. Patient denies excessive thirst, polyuria, polydipsia. Patient denies any swollen glands, patient denies easy bruising or easy bleeding. Patient denies any recent infections, allergies or URI. Patient "s visual fields have not changed significantly in recent time.  Nursing Notes:  Nursing Vital Signs and Chemo Nursing Nursing Notes: *CC Vital Signs Flowsheet:   14-Feb-14 09:05  Vital Signs Type Vital Signs Type Routine  Temp Temperature 97.7  Respirations Respirations 18  SBP SBP 148  DBP DBP 92  Pain Scale (0-10)  0    09:40  Temp  Temperature 97.7  Pulse Pulse 98  Respirations Respirations 18  SBP SBP 148  DBP DBP 92   Physical Exam:  General/Skin/HEENT:  General normal   Skin normal   Eyes normal   ENMT normal   Head and Neck normal   Additional PE well-developed well-nourished female in NAD. Lungs are clear to A&P cardiac examination shows regular rate and rhythm. She status post wide local excision of the right breast. Incision is healed well. There some slight nodularity surrounding the incision site secondary to lumpectomy cavity from this. No other dominant mass or nodularity is noted ineither breast into position examined. Abdomen is benign with no organomegaly or masses noted.   Breasts/Resp/CV/GI/GU:  Respiratory and Thorax normal   Cardiovascular normal   Gastrointestinal normal   Genitourinary normal   MS/Neuro/Psych/Lymph:  Musculoskeletal normal   Neurological normal   Psychiatric normal   Lymphatics normal   Other Results:  Radiology Results: LabUnknown:    08-Jan-14 09:08, Digital Additional Views Rt Breast (SCR)  PACS Image   [facility]:  Digital Additional Views Rt Breast (SCR)   REASON FOR EXAM:    av rt microcals  COMMENTS:       PROCEDURE: MAM - MAM DIG ADDVIEWS RT SCR  - Dec 19 2012  9:08AM     RESULT: Additional views of the right breast obtained. Noted is a   prominent region containing multiple microcalcifications in the upper   outer retroareolar portion of the right breast. On mediolateral   magnification spot films some of these calcifications appear may   represent milk of calcium cysts as they layer. Small well-circumscribed   benign in appearance calcifications are noted as well . However given the   large number microcalcifications and possible mild pleomorphism ,   surgical evaluation recommended as malignancy cannot be excluded.   Surgical biopsy or stereotactic biopsy is highly recommended.  IMPRESSION:  Prominent regional clustered  microcalcifications upper outer   retroareolar portion right breast for which surgical evaluation is   suggested. These may be malignant microcalcifications.    BI-RADS: Category 4 - Suspicious Abnormality - Biopsy Should Be Considered      Thank you for the oppurtunity to contribute to the care of your patient.        Verified By: Osa Craver, M.D., MD   Relevent Results:   Relevant Scans and Labs mammograms are reviewed.   Assessment and Plan: Impression:   stage 0 ductal carcinoma in situ ER/PR positive and 54 year old female status post wide local excision of the right breast. Plan:   at this time I got over treatment options for adjuvant radiation therapy including accelerated partial breast irradiation versus whole breast radiation. Based on her age and ductal carcinoma in situ status she would be a caution her a candidate for partial breast radiation although we'll proceed. Patient based on her second shift working at [employer] is reluctant to go through twice a day treatment and would prefer whole breast radiation. I believe that is a good decision I have set her up for CT simulation next week. Risks and benefits of treatment including redness of the skin, fatigue, inclusion of some superficial lung, or were explained in detail to the patient. She seems to comprehend my treatment plan well. Patient will also be a candidate for tamoxifen therapy  after completion of radiation.  I would like to take this opportunity to thank you for allowing me to continue to participate in this patient's care.  CC Referral:  cc: Dr. Hervey Ard, Dr. Maryland Pink   Electronic Signatures: Baruch Gouty, Roda Shutters (MD)  (Signed 14-Feb-14 11:37)  Authored: HPI, Diagnosis, PFSH, Allergies, Home Meds, ROS, Nursing Notes, Physical Exam, Other Results, Relevent Results, Encounter Assessment and Plan, CC Referring Physician   Last Updated: 14-Feb-14 11:37 by Armstead Peaks (MD)

## 2015-05-23 ENCOUNTER — Other Ambulatory Visit: Payer: Self-pay | Admitting: General Surgery

## 2015-10-01 ENCOUNTER — Ambulatory Visit: Payer: Self-pay | Admitting: Radiation Oncology

## 2015-10-29 ENCOUNTER — Other Ambulatory Visit: Payer: Self-pay | Admitting: *Deleted

## 2015-10-29 DIAGNOSIS — Z853 Personal history of malignant neoplasm of breast: Secondary | ICD-10-CM

## 2015-11-18 ENCOUNTER — Other Ambulatory Visit: Payer: Self-pay | Admitting: General Surgery

## 2015-11-19 ENCOUNTER — Ambulatory Visit: Payer: Self-pay | Admitting: Radiation Oncology

## 2015-12-17 ENCOUNTER — Ambulatory Visit
Admission: RE | Admit: 2015-12-17 | Discharge: 2015-12-17 | Disposition: A | Discharge status: Home / Self Care | Payer: Managed Care, Other (non HMO) | Source: Ambulatory Visit | Attending: Radiation Oncology | Admitting: Radiation Oncology

## 2015-12-17 ENCOUNTER — Encounter: Payer: Self-pay | Admitting: Radiation Oncology

## 2015-12-17 VITALS — BP 142/90 | HR 83 | Temp 96.3°F | Resp 18 | Wt 225.5 lb

## 2015-12-17 DIAGNOSIS — D0511 Intraductal carcinoma in situ of right breast: Secondary | ICD-10-CM

## 2015-12-17 NOTE — Progress Notes (Signed)
Radiation Oncology Follow up Note  Name: Alyssa Barr   Date:   12/17/2015 MRN:  [id number] DOB: June 15, 1961    This 55 y.o. female presents to the clinic today for follow-up for ductal carcinoma in situ of the right breast status post wide local excision and adjuvant whole breast radiation.  REFERRING PROVIDER: Maryland Pink, MD  HPI: Patient is a 55 year old female now out 2-1/2 years having completed whole breast radiation therapy for ductal carcinoma in situ ER/PR positive. She is currently on tamoxifen Tylenol and that well although it states it makes her quite emotional. She specifically denies breast tenderness cough or bone pain. Follow-up mammograms been fine she scheduled for another one in the next couple of months.. She specifically denies breast tenderness cough or bone pain.  COMPLICATIONS OF TREATMENT: none  FOLLOW UP COMPLIANCE: keeps appointments   PHYSICAL EXAM:  BP 142/90 mmHg  Pulse 83  Temp(Src) 96.3 F (35.7 C)  Resp 18  Wt 225 lb 8.5 oz (102.3 kg) Lungs are clear to A&P cardiac examination essentially unremarkable with regular rate and rhythm. No dominant mass or nodularity is noted in either breast in 2 positions examined. Incision is well-healed. No axillary or supraclavicular adenopathy is appreciated. Cosmetic result is excellent. Well-developed well-nourished patient in NAD. HEENT reveals PERLA, EOMI, discs not visualized.  Oral cavity is clear. No oral mucosal lesions are identified. Neck is clear without evidence of cervical or supraclavicular adenopathy. Lungs are clear to A&P. Cardiac examination is essentially unremarkable with regular rate and rhythm without murmur rub or thrill. Abdomen is benign with no organomegaly or masses noted. Motor sensory and DTR levels are equal and symmetric in the upper and lower extremities. Cranial nerves II through XII are grossly intact. Proprioception is intact. No peripheral adenopathy or edema is identified. No motor  or sensory levels are noted. Crude visual fields are within normal range.  RADIOLOGY RESULTS: Mammograms performed 1 year prior showed no evidence of disease and were reviewed  PLAN: Present time she continues to do well with no evidence of disease. She'll have follow-up mammograms this month which ordered been ordered. Otherwise I'm please were overall progress. I've asked her about possibly Effexor with her easy emotional lability and she adamantly refuses that. She continues on tamoxifen therapy. I've asked to see her back in 1 year for follow-up. Patient is to call sooner with any concerns.  I would like to take this opportunity for allowing me to participate in the care of your patient.Armstead Peaks., MD

## 2015-12-28 ENCOUNTER — Other Ambulatory Visit: Payer: Self-pay

## 2015-12-28 ENCOUNTER — Ambulatory Visit: Payer: Self-pay

## 2016-01-04 ENCOUNTER — Ambulatory Visit: Payer: 59 | Admitting: General Surgery

## 2016-01-11 ENCOUNTER — Ambulatory Visit
Admission: RE | Admit: 2016-01-11 | Discharge: 2016-01-11 | Disposition: A | Discharge status: Home / Self Care | Payer: Managed Care, Other (non HMO) | Source: Ambulatory Visit | Attending: General Surgery | Admitting: General Surgery

## 2016-01-11 ENCOUNTER — Other Ambulatory Visit: Payer: Self-pay | Admitting: General Surgery

## 2016-01-11 DIAGNOSIS — Z853 Personal history of malignant neoplasm of breast: Secondary | ICD-10-CM

## 2016-01-11 HISTORY — DX: Malignant neoplasm of unspecified site of unspecified female breast: C50.919

## 2016-01-14 ENCOUNTER — Ambulatory Visit (INDEPENDENT_AMBULATORY_CARE_PROVIDER_SITE_OTHER): Payer: Managed Care, Other (non HMO) | Admitting: General Surgery

## 2016-01-14 ENCOUNTER — Encounter: Payer: Self-pay | Admitting: General Surgery

## 2016-01-14 VITALS — BP 136/84 | HR 82 | Resp 14 | Ht 66.0 in | Wt 224.0 lb

## 2016-01-14 DIAGNOSIS — D0511 Intraductal carcinoma in situ of right breast: Secondary | ICD-10-CM | POA: Diagnosis not present

## 2016-01-14 NOTE — Progress Notes (Signed)
Patient ID: [name], female   DOB: 1961-04-30, 55 y.o.   MRN: [id number]  Chief Complaint  Patient presents with  . Follow-up    mammogram    HPI Alyssa Barr is a 55 y.o. female.  who presents for her breast cancer follow up and a breast evaluation. The most recent mammogram was done on 01/11/2016.  Patient does perform regular self breast checks and gets regular mammograms done.  No new breast issues. She states she is tolerating the Tamoxifen well.  I personally reviewed the patient's history.   HPI  Past Medical History  Diagnosis Date  . Hypertension 2012  . Mammographic microcalcification 12/31/2012    right breast  . 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 Breast cancer [facility]) 2014    radiation    Past Surgical History  Procedure Laterality Date  . Breast surgery Right 01/10/2013    wide excision, low grade DCIS, ER/PR positive tumor. Post procedure radiation therapy to start 02/11/13  . Cesarean section  [id number]  . Appendectomy      age of 54   . Endometrial biopsy  01/07/2014    Barnett Applebaum, M.D. for endometrial prominence and postmenopausal bleeding.  . Colonoscopy  09/03/2013    Jeorge Reister  . Breast biopsy Right 01/03/2013    stereo, low grade DCIS  . Breast excisional biopsy Right 01/15/13    positive    Family History  Problem Relation Age of Onset  . Cancer Maternal Grandmother 80    Blastomycosis of the jaw-late 80's  . Cancer Paternal Grandfather 4    colon cancer in late 39's  . Cancer Cousin 50    breast cancer in late 26's  . Breast cancer Cousin     Social History Social History  Substance Use Topics  . Smoking status: Never Smoker   . Smokeless tobacco: Never Used  . Alcohol Use: 0.0 oz/week     Comment: ocassionally less than 1 beer weekly    Allergies  Allergen Reactions  . Sulfa Antibiotics  Itching    Current Outpatient Prescriptions  Medication Sig Dispense Refill  . b complex vitamins tablet Take 1 tablet by mouth daily.    . Cholecalciferol (VITAMIN D3) 1000 UNITS CAPS Take 2 capsules by mouth daily.     Marland Kitchen lisinopril-hydrochlorothiazide (PRINZIDE,ZESTORETIC) 20-25 MG per tablet Take 1 tablet by mouth daily.    . tamoxifen (NOLVADEX) 20 MG tablet Take 1 tablet by mouth  every day 90 tablet 3   No current facility-administered medications for this visit.    Review of Systems Review of Systems  Constitutional: Negative.   Respiratory: Negative.   Cardiovascular: Negative.     Blood pressure 136/84, pulse 82, resp. rate 14, height 5\' 6"  (1.676 m), weight 224 lb (101.606 kg), last menstrual period 11/30/2012.  Physical Exam Physical Exam  Constitutional: She is oriented to person, place, and time. She appears well-developed and well-nourished.  HENT:  Mouth/Throat: Oropharynx is clear and moist.  Eyes: Conjunctivae are normal. No scleral icterus.  Neck: Neck supple.  Cardiovascular: Normal rate, regular rhythm and normal heart sounds.   Pulmonary/Chest: Effort normal and breath sounds normal. Right breast exhibits no inverted nipple, no mass, no nipple discharge, no skin change and no tenderness. Left breast exhibits no inverted nipple, no mass, no nipple discharge, no skin change and no tenderness.  Well healed incision right breast.  Lymphadenopathy:    She has no cervical adenopathy.  Neurological: She is alert and oriented to person, place, and time.  Skin: Skin is warm and dry.  Psychiatric: Her behavior is normal.    Data Reviewed Mammograms of 01/11/2016 were independently reviewed. Postsurgical/radiation changes noted in the right breast. BI-RADS-2.  Assessment    Doing well now almost 3 years status post management of low-grade DCIS of the right breast. Good tolerance of tamoxifen.    Plan    The patient reports that she will be following up with  Dr. Kenton Kingfisher from GYN regarding a previously noted endometrial thickening.  Endometrial biopsy completed in January 2015 was negative for malignancy.    The patient has been asked to return to the office in one year with a bilateral diagnostic mammogram  PCP:  [place] Pink This information has been scribed by Karie Fetch RNBC.    Robert Bellow 01/15/2016, 1:24 PM

## 2016-01-14 NOTE — Patient Instructions (Addendum)
The patient is aware to call back for any questions or concerns. Continue self breast exams. Call office for any new breast issues or concerns. The patient has been asked to return to the office in one year with a bilateral diagnostic mammogram. 

## 2016-01-15 DIAGNOSIS — D0510 Intraductal carcinoma in situ of unspecified breast: Secondary | ICD-10-CM | POA: Insufficient documentation

## 2016-04-09 ENCOUNTER — Emergency Department: Payer: Managed Care, Other (non HMO)

## 2016-04-09 ENCOUNTER — Emergency Department
Admission: EM | Admit: 2016-04-09 | Discharge: 2016-04-10 | Disposition: A | Discharge status: Home / Self Care | Payer: Managed Care, Other (non HMO) | Attending: Emergency Medicine | Admitting: Emergency Medicine

## 2016-04-09 ENCOUNTER — Encounter: Payer: Self-pay | Admitting: Emergency Medicine

## 2016-04-09 DIAGNOSIS — Y929 Unspecified place or not applicable: Secondary | ICD-10-CM | POA: Diagnosis not present

## 2016-04-09 DIAGNOSIS — Y999 Unspecified external cause status: Secondary | ICD-10-CM | POA: Diagnosis not present

## 2016-04-09 DIAGNOSIS — Y939 Activity, unspecified: Secondary | ICD-10-CM | POA: Diagnosis not present

## 2016-04-09 DIAGNOSIS — C50411 Malignant neoplasm of upper-outer quadrant of right female breast: Secondary | ICD-10-CM | POA: Diagnosis not present

## 2016-04-09 DIAGNOSIS — S0990XA Unspecified injury of head, initial encounter: Secondary | ICD-10-CM

## 2016-04-09 DIAGNOSIS — W2201XA Walked into wall, initial encounter: Secondary | ICD-10-CM | POA: Insufficient documentation

## 2016-04-09 DIAGNOSIS — Z79899 Other long term (current) drug therapy: Secondary | ICD-10-CM | POA: Insufficient documentation

## 2016-04-09 DIAGNOSIS — S060X0A Concussion without loss of consciousness, initial encounter: Secondary | ICD-10-CM | POA: Diagnosis not present

## 2016-04-09 DIAGNOSIS — I10 Essential (primary) hypertension: Secondary | ICD-10-CM | POA: Insufficient documentation

## 2016-04-09 LAB — CBC WITH DIFFERENTIAL/PLATELET
Basophils Absolute: 0 10*3/uL (ref 0–0.1)
Basophils Relative: 1 %
Eosinophils Absolute: 0.1 10*3/uL (ref 0–0.7)
Eosinophils Relative: 1 %
HEMATOCRIT: 34.5 % — AB (ref 35.0–47.0)
HEMOGLOBIN: 11.6 g/dL — AB (ref 12.0–16.0)
LYMPHS ABS: 1.3 10*3/uL (ref 1.0–3.6)
LYMPHS PCT: 22 %
MCH: 31.7 pg (ref 26.0–34.0)
MCHC: 33.5 g/dL (ref 32.0–36.0)
MCV: 94.6 fL (ref 80.0–100.0)
MONOS PCT: 8 %
Monocytes Absolute: 0.5 10*3/uL (ref 0.2–0.9)
NEUTROS PCT: 68 %
Neutro Abs: 4.1 10*3/uL (ref 1.4–6.5)
Platelets: 199 10*3/uL (ref 150–440)
RBC: 3.65 MIL/uL — AB (ref 3.80–5.20)
RDW: 13.8 % (ref 11.5–14.5)
WBC: 6 10*3/uL (ref 3.6–11.0)

## 2016-04-09 LAB — BASIC METABOLIC PANEL
Anion gap: 9 (ref 5–15)
BUN: 17 mg/dL (ref 6–20)
CHLORIDE: 105 mmol/L (ref 101–111)
CO2: 23 mmol/L (ref 22–32)
Calcium: 8.7 mg/dL — ABNORMAL LOW (ref 8.9–10.3)
Creatinine, Ser: 0.76 mg/dL (ref 0.44–1.00)
GFR calc Af Amer: >60 mL/min (ref >60)
GFR calc non Af Amer: >60 mL/min (ref >60)
GLUCOSE: 110 mg/dL — AB (ref 65–99)
POTASSIUM: 3.8 mmol/L (ref 3.5–5.1)
Sodium: 137 mmol/L (ref 135–145)

## 2016-04-09 MED ORDER — MORPHINE SULFATE (PF) 2 MG/ML IV SOLN
2.0000 mg | Freq: Once | INTRAVENOUS | Status: AC
  Administered 2016-04-09: 2 mg via INTRAVENOUS
  Filled 2016-04-09: qty 1

## 2016-04-09 MED ORDER — SODIUM CHLORIDE 0.9 % IV BOLUS (SEPSIS)
1000.0000 mL | Freq: Once | INTRAVENOUS | Status: AC
  Administered 2016-04-10: 1000 mL via INTRAVENOUS

## 2016-04-09 MED ORDER — ONDANSETRON HCL 4 MG/2ML IJ SOLN
4.0000 mg | Freq: Once | INTRAMUSCULAR | Status: AC
  Administered 2016-04-09: 4 mg via INTRAVENOUS
  Filled 2016-04-09: qty 2

## 2016-04-09 NOTE — ED Provider Notes (Signed)
[facility] Emergency Department Provider Note   ____________________________________________  Time seen: Approximately 11:12 PM  I have reviewed the triage vital signs and the nursing notes.   HISTORY  Chief Complaint Head Injury    HPI Alyssa Barr is a 55 y.o. female who presents to the ED from home with a chief complaint of head injury. Patient reports falling out of her truck at 8 AM this morning, striking her posterior head against a brick wall. Denies LOC but states she was dazed. She attended an outdoor wedding this afternoon and reports nausea, vomiting x 4 and dizziness since.Complains of generalized headache, persistent dizziness and nausea. Denies associated neck pain, vision changes, numbness/tingling, chest pain, shortness of breath, abdominal pain, diarrhea. Took ibuprofen this morning without relief of symptoms. Denies recent travel. Denies use of anticoagulants.   Past Medical History  Diagnosis Date  . Hypertension 2012  . Mammographic microcalcification 12/31/2012    right breast  . 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 Breast cancer [facility]) 2014    radiation    Patient Active Problem List   Diagnosis Date Noted  . 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 Malignant neoplasm of upper-outer quadrant of female breast [facility])     Past Surgical History  Procedure Laterality Date  . Breast surgery Right 01/10/2013    wide excision, low grade DCIS, ER/PR positive tumor. Post procedure radiation therapy to start 02/11/13  . Cesarean section  [id number]  . Appendectomy      age of 9   . Endometrial biopsy  01/07/2014    Barnett Applebaum, M.D. for endometrial prominence and postmenopausal bleeding.  . Colonoscopy  09/03/2013    [facility]  . Breast biopsy Right 01/03/2013    stereo, low grade DCIS  . Breast  excisional biopsy Right 01/15/13    positive    Current Outpatient Rx  Name  Route  Sig  Dispense  Refill  . B Complex-C (B-COMPLEX WITH VITAMIN C) tablet   Oral   Take 1 tablet by mouth daily.         . cholecalciferol (VITAMIN D) 1000 units tablet   Oral   Take 2,000 Units by mouth daily.         . furosemide (LASIX) 20 MG tablet   Oral   Take 20 mg by mouth daily as needed for edema.         Marland Kitchen lisinopril-hydrochlorothiazide (PRINZIDE,ZESTORETIC) 20-25 MG per tablet   Oral   Take 1 tablet by mouth daily.         . potassium chloride (K-DUR) 10 MEQ tablet   Oral   Take 10 mEq by mouth daily as needed (when taking Lasix).         . tamoxifen (NOLVADEX) 20 MG tablet   Oral   Take 20 mg by mouth at bedtime.          Marland Kitchen HYDROcodone-acetaminophen (NORCO) 5-325 MG tablet   Oral   Take 1 tablet by mouth every 6 (six) hours as needed for moderate pain.   15 tablet   0   . ondansetron (ZOFRAN ODT) 4 MG disintegrating tablet   Oral   Take 1 tablet (4 mg total) by mouth every 8 (eight) hours as needed for nausea or vomiting.   15 tablet   0  Allergies Sulfa antibiotics  Family History  Problem Relation Age of Onset  . Cancer Maternal Grandmother 80    Blastomycosis of the jaw-late 80's  . Cancer Paternal Grandfather 81    colon cancer in late 66's  . Cancer Cousin 50    breast cancer in late 78's  . Breast cancer Cousin     Social History Social History  Substance Use Topics  . Smoking status: Never Smoker   . Smokeless tobacco: Never Used  . Alcohol Use: 0.0 oz/week     Comment: ocassionally less than 1 beer weekly    Review of Systems  Constitutional: No fever/chills. Eyes: No visual changes. ENT: No sore throat. Cardiovascular: Denies chest pain. Respiratory: Denies shortness of breath. Gastrointestinal: No abdominal pain.  Positive for nausea and vomiting.  No diarrhea.  No constipation. Genitourinary: Negative for  dysuria. Musculoskeletal: Negative for back pain. Skin: Negative for rash. Neurological: Positive for headache and dizziness. Negative for focal weakness or numbness.  10-point ROS otherwise negative.  ____________________________________________   PHYSICAL EXAM:  VITAL SIGNS: ED Triage Vitals  Enc Vitals Group     BP 04/09/16 2241 132/80 mmHg     Pulse Rate 04/09/16 2241 105     Resp 04/09/16 2241 18     Temp 04/09/16 2241 98.4 F (36.9 C)     Temp Source 04/09/16 2241 Oral     SpO2 04/09/16 2241 99 %     Weight 04/09/16 2241 228 lb (103.42 kg)     Height 04/09/16 2241 5\' 6"  (1.676 m)     Head Cir --      Peak Flow --      Pain Score 04/09/16 2242 10     Pain Loc --      Pain Edu? --      Excl. in [place]? --     Constitutional: Alert and oriented. Well appearing and in mild acute distress. Eyes: Conjunctivae are normal. PERRL. EOMI. Head: Atraumatic. Nose: No congestion/rhinnorhea. Mouth/Throat: Mucous membranes are mildly dry.  Oropharynx non-erythematous. Neck: No stridor.  No cervical spine tenderness to palpation. No step-offs or deformities. No carotid bruits. Cardiovascular: Normal rate, regular rhythm. Grossly normal heart sounds.  Good peripheral circulation. Respiratory: Normal respiratory effort.  No retractions. Lungs CTAB. Gastrointestinal: Soft and nontender. No distention. No abdominal bruits. No CVA tenderness. Musculoskeletal: No lower extremity tenderness nor edema.  No joint effusions. Neurologic:  Normal speech and language. No gross focal neurologic deficits are appreciated. Alert and oriented 4. Skin:  Skin is warm, dry and intact. No rash noted. Psychiatric: Mood and affect are normal. Speech and behavior are normal.  ____________________________________________   LABS (all labs ordered are listed, but only abnormal results are displayed)  Labs Reviewed  CBC WITH DIFFERENTIAL/PLATELET - Abnormal; Notable for the following:    RBC 3.65 (*)     Hemoglobin 11.6 (*)    HCT 34.5 (*)    All other components within normal limits  BASIC METABOLIC PANEL - Abnormal; Notable for the following:    Glucose, Bld 110 (*)    Calcium 8.7 (*)    All other components within normal limits   ____________________________________________  EKG  ED ECG REPORT I, Daliya Parchment J, the attending physician, personally viewed and interpreted this ECG.   Date: 04/09/2016  EKG Time: 2245  Rate: 102  Rhythm: sinus tachycardia  Axis: Normal  Intervals:none  ST&T Change: Nonspecific  ____________________________________________  RADIOLOGY  CT head without contrast interpreted per Dr. Pascal Lux: Normal  study. ____________________________________________   PROCEDURES  Procedure(s) performed: None  Critical Care performed: No  ____________________________________________   INITIAL IMPRESSION / ASSESSMENT AND PLAN / ED COURSE  Pertinent labs & imaging results that were available during my care of the patient were reviewed by me and considered in my medical decision making (see chart for details).  55 year old female who presents approximately 15 hours s/p mechanical fall with minor head injury with continued nausea, vomiting and dizziness. Will initiate IV fluid resuscitation, analgesia, antiemetic and obtain CT head to evaluate for intracranial hemorrhage.  ----------------------------------------- 12:09 AM on 04/10/2016 -----------------------------------------  Patient sleeping soundly no acute distress. Updated family members of laboratory and imaging results. IV fluids infusing.  ----------------------------------------- 1:18 AM on 04/10/2016 -----------------------------------------  IV fluids completed. Patient tolerated PO without emesis. Strict return precautions given. All verbalize understanding and agree with plan of care. ____________________________________________   FINAL CLINICAL IMPRESSION(S) / ED DIAGNOSES  Final  diagnoses:  Head injury, initial encounter  Concussion, without loss of consciousness, initial encounter      NEW MEDICATIONS STARTED DURING THIS VISIT:  New Prescriptions   HYDROCODONE-ACETAMINOPHEN (NORCO) 5-325 MG TABLET    Take 1 tablet by mouth every 6 (six) hours as needed for moderate pain.   ONDANSETRON (ZOFRAN ODT) 4 MG DISINTEGRATING TABLET    Take 1 tablet (4 mg total) by mouth every 8 (eight) hours as needed for nausea or vomiting.     Note:  This document was prepared using Dragon voice recognition software and may include unintentional dictation errors.    Paulette Blanch, MD 04/10/16 [phone]

## 2016-04-09 NOTE — ED Notes (Signed)
Patient states that she fell out the back of a truck this morning. Patient reports that she hit her head but denies LOC. Patient reports that this afternoon patient reports nausea, vomiting and dizziness. Patient does not take blood thinners.

## 2016-04-09 NOTE — ED Notes (Signed)
Patient transported to CT via stretcher.

## 2016-04-09 NOTE — ED Notes (Signed)
Pt voiced need to void; wheelchair moved beside toilet and pt able to stand on her own and use grab bar for assistance

## 2016-04-09 NOTE — ED Notes (Signed)
MD at bedside. 

## 2016-04-10 MED ORDER — ONDANSETRON 4 MG PO TBDP: 4.0000 mg | ORAL_TABLET | Freq: Three times a day (TID) | ORAL | Status: DC | PRN

## 2016-04-10 MED ORDER — HYDROCODONE-ACETAMINOPHEN 5-325 MG PO TABS: 1.0000 | ORAL_TABLET | Freq: Four times a day (QID) | ORAL | Status: DC | PRN

## 2016-04-10 NOTE — ED Notes (Signed)
Asked pt about her pain level since receiving Morphine; says it's a 10 at this time, half of the 20/10 it was when she arrived;

## 2016-04-10 NOTE — Discharge Instructions (Signed)
1. You may take pain and nausea medicines as needed (Norco/Zofran #15). 2. Drink plenty of fluids daily. 3. Return to the ER for worsening symptoms, persistent vomiting, lethargy or other concerns.  Concussion, Adult A concussion, or closed-head injury, is a brain injury caused by a direct blow to the head or by a quick and sudden movement (jolt) of the head or neck. Concussions are usually not life-threatening. Even so, the effects of a concussion can be serious. If you have had a concussion before, you are more likely to experience concussion-like symptoms after a direct blow to the head.  CAUSES  Direct blow to the head, such as from running into another player during a soccer game, being hit in a fight, or hitting your head on a hard surface.  A jolt of the head or neck that causes the brain to move back and forth inside the skull, such as in a car crash. SIGNS AND SYMPTOMS The signs of a concussion can be hard to notice. Early on, they may be missed by you, family members, and health care providers. You may look fine but act or feel differently. Symptoms are usually temporary, but they may last for days, weeks, or even longer. Some symptoms may appear right away while others may not show up for hours or days. Every head injury is different. Symptoms include:  Mild to moderate headaches that will not go away.  A feeling of pressure inside your head.  Having more trouble than usual:  Learning or remembering things you have heard.  Answering questions.  Paying attention or concentrating.  Organizing daily tasks.  Making decisions and solving problems.  Slowness in thinking, acting or reacting, speaking, or reading.  Getting lost or being easily confused.  Feeling tired all the time or lacking energy (fatigued).  Feeling drowsy.  Sleep disturbances.  Sleeping more than usual.  Sleeping less than usual.  Trouble falling asleep.  Trouble sleeping (insomnia).  Loss of  balance or feeling lightheaded or dizzy.  Nausea or vomiting.  Numbness or tingling.  Increased sensitivity to:  Sounds.  Lights.  Distractions.  Vision problems or eyes that tire easily.  Diminished sense of taste or smell.  Ringing in the ears.  Mood changes such as feeling sad or anxious.  Becoming easily irritated or angry for little or no reason.  Lack of motivation.  Seeing or hearing things other people do not see or hear (hallucinations). DIAGNOSIS Your health care provider can usually diagnose a concussion based on a description of your injury and symptoms. He or she will ask whether you passed out (lost consciousness) and whether you are having trouble remembering events that happened right before and during your injury. Your evaluation might include:  A brain scan to look for signs of injury to the brain. Even if the test shows no injury, you may still have a concussion.  Blood tests to be sure other problems are not present. TREATMENT  Concussions are usually treated in an emergency department, in urgent care, or at a clinic. You may need to stay in the hospital overnight for further treatment.  Tell your health care provider if you are taking any medicines, including prescription medicines, over-the-counter medicines, and natural remedies. Some medicines, such as blood thinners (anticoagulants) and aspirin, may increase the chance of complications. Also tell your health care provider whether you have had alcohol or are taking illegal drugs. This information may affect treatment.  Your health care provider will send you home  with important instructions to follow.  How fast you will recover from a concussion depends on many factors. These factors include how severe your concussion is, what part of your brain was injured, your age, and how healthy you were before the concussion.  Most people with mild injuries recover fully. Recovery can take time. In general,  recovery is slower in older persons. Also, persons who have had a concussion in the past or have other medical problems may find that it takes longer to recover from their current injury. HOME CARE INSTRUCTIONS General Instructions  Carefully follow the directions your health care provider gave you.  Only take over-the-counter or prescription medicines for pain, discomfort, or fever as directed by your health care provider.  Take only those medicines that your health care provider has approved.  Do not drink alcohol until your health care provider says you are well enough to do so. Alcohol and certain other drugs may slow your recovery and can put you at risk of further injury.  If it is harder than usual to remember things, write them down.  If you are easily distracted, try to do one thing at a time. For example, do not try to watch TV while fixing dinner.  Talk with family members or close friends when making important decisions.  Keep all follow-up appointments. Repeated evaluation of your symptoms is recommended for your recovery.  Watch your symptoms and tell others to do the same. Complications sometimes occur after a concussion. Older adults with a brain injury may have a higher risk of serious complications, such as a blood clot on the brain.  Tell your teachers, school nurse, school counselor, coach, athletic trainer, or work Freight forwarder about your injury, symptoms, and restrictions. Tell them about what you can or cannot do. They should watch for:  Increased problems with attention or concentration.  Increased difficulty remembering or learning new information.  Increased time needed to complete tasks or assignments.  Increased irritability or decreased ability to cope with stress.  Increased symptoms.  Rest. Rest helps the brain to heal. Make sure you:  Get plenty of sleep at night. Avoid staying up late at night.  Keep the same bedtime hours on weekends and  weekdays.  Rest during the day. Take daytime naps or rest breaks when you feel tired.  Limit activities that require a lot of thought or concentration. These include:  Doing homework or job-related work.  Watching TV.  Working on the computer.  Avoid any situation where there is potential for another head injury (football, hockey, soccer, basketball, martial arts, downhill snow sports and horseback riding). Your condition will get worse every time you experience a concussion. You should avoid these activities until you are evaluated by the appropriate follow-up health care providers. Returning To Your Regular Activities You will need to return to your normal activities slowly, not all at once. You must give your body and brain enough time for recovery.  Do not return to sports or other athletic activities until your health care provider tells you it is safe to do so.  Ask your health care provider when you can drive, ride a bicycle, or operate heavy machinery. Your ability to react may be slower after a brain injury. Never do these activities if you are dizzy.  Ask your health care provider about when you can return to work or school. Preventing Another Concussion It is very important to avoid another brain injury, especially before you have recovered. In rare cases,  another injury can lead to permanent brain damage, brain swelling, or death. The risk of this is greatest during the first 7-10 days after a head injury. Avoid injuries by:  Wearing a seat belt when riding in a car.  Drinking alcohol only in moderation.  Wearing a helmet when biking, skiing, skateboarding, skating, or doing similar activities.  Avoiding activities that could lead to a second concussion, such as contact or recreational sports, until your health care provider says it is okay.  Taking safety measures in your home.  Remove clutter and tripping hazards from floors and stairways.  Use grab bars in bathrooms  and handrails by stairs.  Place non-slip mats on floors and in bathtubs.  Improve lighting in dim areas. SEEK MEDICAL CARE IF:  You have increased problems paying attention or concentrating.  You have increased difficulty remembering or learning new information.  You need more time to complete tasks or assignments than before.  You have increased irritability or decreased ability to cope with stress.  You have more symptoms than before. Seek medical care if you have any of the following symptoms for more than 2 weeks after your injury:  Lasting (chronic) headaches.  Dizziness or balance problems.  Nausea.  Vision problems.  Increased sensitivity to noise or light.  Depression or mood swings.  Anxiety or irritability.  Memory problems.  Difficulty concentrating or paying attention.  Sleep problems.  Feeling tired all the time. SEEK IMMEDIATE MEDICAL CARE IF:  You have severe or worsening headaches. These may be a sign of a blood clot in the brain.  You have weakness (even if only in one hand, leg, or part of the face).  You have numbness.  You have decreased coordination.  You vomit repeatedly.  You have increased sleepiness.  One pupil is larger than the other.  You have convulsions.  You have slurred speech.  You have increased confusion. This may be a sign of a blood clot in the brain.  You have increased restlessness, agitation, or irritability.  You are unable to recognize people or places.  You have neck pain.  It is difficult to wake you up.  You have unusual behavior changes.  You lose consciousness. MAKE SURE YOU:  Understand these instructions.  Will watch your condition.  Will get help right away if you are not doing well or get worse.   This information is not intended to replace advice given to you by your health care provider. Make sure you discuss any questions you have with your health care provider.   Document Released:  02/18/2004 Document Revised: 12/19/2014 Document Reviewed: 06/20/2013 Elsevier Interactive Patient Education 2016 [place] Injury, Adult You have a head injury. Headaches and throwing up (vomiting) are common after a head injury. It should be easy to wake up from sleeping. Sometimes you must stay in the hospital. Most problems happen within the first 24 hours. Side effects may occur up to 7-10 days after the injury.  WHAT ARE THE TYPES OF HEAD INJURIES? Head injuries can be as minor as a bump. Some head injuries can be more severe. More severe head injuries include:  A jarring injury to the brain (concussion).  A bruise of the brain (contusion). This mean there is bleeding in the brain that can cause swelling.  A cracked skull (skull fracture).  Bleeding in the brain that collects, clots, and forms a bump (hematoma). WHEN SHOULD I GET HELP RIGHT AWAY?   You are confused or  sleepy.  You cannot be woken up.  You feel sick to your stomach (nauseous) or keep throwing up (vomiting).  Your dizziness or unsteadiness is getting worse.  You have very bad, lasting headaches that are not helped by medicine. Take medicines only as told by your doctor.  You cannot use your arms or legs like normal.  You cannot walk.  You notice changes in the black spots in the center of the colored part of your eye (pupil).  You have clear or bloody fluid coming from your nose or ears.  You have trouble seeing. During the next 24 hours after the injury, you must stay with someone who can watch you. This person should get help right away (call 911 in the [place] if you start to shake and are not able to control it (have seizures), you pass out, or you are unable to wake up. HOW CAN I PREVENT A HEAD INJURY IN THE FUTURE?  Wear seat belts.  Wear a helmet while bike riding and playing sports like football.  Stay away from dangerous activities around the house. WHEN CAN I RETURN TO NORMAL  ACTIVITIES AND ATHLETICS? See your doctor before doing these activities. You should not do normal activities or play contact sports until 1 week after the following symptoms have stopped:  Headache that does not go away.  Dizziness.  Poor attention.  Confusion.  Memory problems.  Sickness to your stomach or throwing up.  Tiredness.  Fussiness.  Bothered by bright lights or loud noises.  Anxiousness or depression.  Restless sleep. MAKE SURE YOU:   Understand these instructions.  Will watch your condition.  Will get help right away if you are not doing well or get worse.   This information is not intended to replace advice given to you by your health care provider. Make sure you discuss any questions you have with your health care provider.   Document Released: 11/10/2008 Document Revised: 12/19/2014 Document Reviewed: 08/05/2013 Elsevier Interactive Patient Education [employer].

## 2016-05-29 ENCOUNTER — Other Ambulatory Visit: Payer: Self-pay | Admitting: General Surgery

## 2016-11-17 ENCOUNTER — Other Ambulatory Visit: Payer: Self-pay

## 2016-11-17 DIAGNOSIS — D0511 Intraductal carcinoma in situ of right breast: Secondary | ICD-10-CM

## 2016-12-22 ENCOUNTER — Encounter: Payer: Self-pay | Admitting: Radiation Oncology

## 2016-12-22 ENCOUNTER — Ambulatory Visit
Admission: RE | Admit: 2016-12-22 | Discharge: 2016-12-22 | Disposition: A | Discharge status: Home / Self Care | Payer: Managed Care, Other (non HMO) | Source: Ambulatory Visit | Attending: Radiation Oncology | Admitting: Radiation Oncology

## 2016-12-22 VITALS — BP 146/90 | HR 84 | Temp 99.0°F | Resp 18 | Wt 231.7 lb

## 2016-12-22 DIAGNOSIS — D0511 Intraductal carcinoma in situ of right breast: Secondary | ICD-10-CM | POA: Diagnosis present

## 2016-12-22 DIAGNOSIS — Z170 Estrogen receptor positive status [ER+]: Secondary | ICD-10-CM | POA: Diagnosis not present

## 2016-12-22 DIAGNOSIS — Z79810 Long term (current) use of selective estrogen receptor modulators (SERMs): Secondary | ICD-10-CM | POA: Diagnosis not present

## 2016-12-22 DIAGNOSIS — Z923 Personal history of irradiation: Secondary | ICD-10-CM | POA: Diagnosis not present

## 2016-12-22 NOTE — Progress Notes (Signed)
Radiation Oncology Follow up Note  Name: Alyssa Barr   Date:   12/22/2016 MRN:  [id number] DOB: 1961-02-18    This 56 y.o. female presents to the clinic today for 3 have your follow-up status post whole breast radiation for ductal carcinoma in situ ER/PR positive.  REFERRING PROVIDER: Maryland Pink, MD  HPI: patient is a 56 year old female now out 3/2 years having completed whole breast radiation to her right breast for ductal carcinoma in situ ER/PR positive. Currently on tamoxifen tolerating that well without side effect..follow mammograms have been fine she scheduled for further mammograms on a yearly basis in February. She specifically denies breast tenderness cough or bone pain.  COMPLICATIONS OF TREATMENT: none  FOLLOW UP COMPLIANCE: keeps appointments   PHYSICAL EXAM:  BP (!) 146/90   Pulse 84   Temp 99 F (37.2 C)   Resp 18   Wt 231 lb 11.3 oz (105.1 kg)   LMP 11/30/2012   BMI 37.40 kg/m  Lungs are clear to A&P cardiac examination essentially unremarkable with regular rate and rhythm. No dominant mass or nodularity is noted in either breast in 2 positions examined. Incision is well-healed. No axillary or supraclavicular adenopathy is appreciated. Cosmetic result is excellent. Well-developed well-nourished patient in NAD. HEENT reveals PERLA, EOMI, discs not visualized.  Oral cavity is clear. No oral mucosal lesions are identified. Neck is clear without evidence of cervical or supraclavicular adenopathy. Lungs are clear to A&P. Cardiac examination is essentially unremarkable with regular rate and rhythm without murmur rub or thrill. Abdomen is benign with no organomegaly or masses noted. Motor sensory and DTR levels are equal and symmetric in the upper and lower extremities. Cranial nerves II through XII are grossly intact. Proprioception is intact. No peripheral adenopathy or edema is identified. No motor or sensory levels are noted. Crude visual fields are within normal  range.  RADIOLOGY RESULTS: last mammograms are reviewed will review her future once in February.  PLAN: present time she is doing well 3 and half years out with no evidence of disease. I'm please were overall progress. I've asked to see her back in 1 year and then will discontinue follow-up care. Patient is to: Any time with any concerns.  I would like to take this opportunity to thank you for allowing me to participate in the care of your patient.Armstead Peaks., MD

## 2016-12-25 ENCOUNTER — Other Ambulatory Visit: Payer: Self-pay | Admitting: General Surgery

## 2017-01-06 ENCOUNTER — Ambulatory Visit
Admission: RE | Admit: 2017-01-06 | Discharge: 2017-01-06 | Disposition: A | Discharge status: Home / Self Care | Payer: Managed Care, Other (non HMO) | Source: Ambulatory Visit | Attending: Student | Admitting: Student

## 2017-01-06 ENCOUNTER — Other Ambulatory Visit: Payer: Self-pay | Admitting: Student

## 2017-01-06 DIAGNOSIS — M79605 Pain in left leg: Secondary | ICD-10-CM | POA: Insufficient documentation

## 2017-01-06 DIAGNOSIS — M79604 Pain in right leg: Secondary | ICD-10-CM

## 2017-01-06 DIAGNOSIS — Z79810 Long term (current) use of selective estrogen receptor modulators (SERMs): Secondary | ICD-10-CM

## 2017-01-06 DIAGNOSIS — R7989 Other specified abnormal findings of blood chemistry: Secondary | ICD-10-CM

## 2017-01-20 ENCOUNTER — Ambulatory Visit
Admission: RE | Admit: 2017-01-20 | Discharge: 2017-01-20 | Disposition: A | Discharge status: Home / Self Care | Payer: Managed Care, Other (non HMO) | Source: Ambulatory Visit | Attending: General Surgery | Admitting: General Surgery

## 2017-01-20 DIAGNOSIS — Z853 Personal history of malignant neoplasm of breast: Secondary | ICD-10-CM | POA: Insufficient documentation

## 2017-01-20 DIAGNOSIS — D0511 Intraductal carcinoma in situ of right breast: Secondary | ICD-10-CM | POA: Diagnosis present

## 2017-01-31 ENCOUNTER — Encounter: Payer: Self-pay | Admitting: General Surgery

## 2017-01-31 ENCOUNTER — Ambulatory Visit (INDEPENDENT_AMBULATORY_CARE_PROVIDER_SITE_OTHER): Payer: Managed Care, Other (non HMO) | Admitting: General Surgery

## 2017-01-31 VITALS — BP 124/70 | HR 88 | Resp 14 | Ht 66.0 in | Wt 235.0 lb

## 2017-01-31 DIAGNOSIS — D0511 Intraductal carcinoma in situ of right breast: Secondary | ICD-10-CM

## 2017-01-31 NOTE — Patient Instructions (Signed)
Patient will be asked to return to the office in one year with a bilateral diagnotic  mammogram. 

## 2017-01-31 NOTE — Progress Notes (Signed)
Patient ID: Alyssa Barr, female   DOB: 1961/06/07, 56 y.o.   MRN: [id number]  Chief Complaint  Patient presents with  . Follow-up    mammogram    HPI Alyssa Barr is a 56 y.o. female who presents for a breast evaluation. The most recent mammogram was done on 01/20/2017.  Patient does perform regular self breast checks and gets regular mammograms done.  Patient states she had an bilateral leg ultrasound done 01/06/2017.  Marland Kitchen She states she has always had swelling in her legs. Doing well on Tamoxifen.   HPI  Past Medical History:  Diagnosis Date  . Breast cancer ([place]) 2014   radiation- Right  . Hypertension 2012  . 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 Mammographic microcalcification 12/31/2012   right breast    Past Surgical History:  Procedure Laterality Date  . APPENDECTOMY     age of 60   . BREAST BIOPSY Right 01/03/2013   stereo, low grade DCIS  . BREAST EXCISIONAL BIOPSY Right 01/15/13   positive  . BREAST SURGERY Right 01/10/2013   wide excision, low grade DCIS, ER/PR positive tumor. Post procedure radiation therapy to start 02/11/13  . CESAREAN SECTION  [id number]  . COLONOSCOPY  09/03/2013   Tommey Barret  . ENDOMETRIAL BIOPSY  01/07/2014   Barnett Applebaum, M.D. for endometrial prominence and postmenopausal bleeding.    Family History  Problem Relation Age of Onset  . Cancer Cousin 50    breast cancer in late 73's  . Breast cancer Cousin   . Cancer Maternal Grandmother 80    Blastomycosis of the jaw-late 80's  . Cancer Paternal Grandfather 23    colon cancer in late 25's    Social History Social History  Substance Use Topics  . Smoking status: Never Smoker  . Smokeless tobacco: Never Used  . Alcohol use 0.0 oz/week     Comment: ocassionally less than 1 beer weekly    Allergies  Allergen Reactions  . Sulfa Antibiotics Itching,  Swelling, Rash and Other (See Comments)    Reaction:  Hand swelling     Current Outpatient Prescriptions  Medication Sig Dispense Refill  . B Complex-C (B-COMPLEX WITH VITAMIN C) tablet Take 1 tablet by mouth daily.    . cholecalciferol (VITAMIN D) 1000 units tablet Take 2,000 Units by mouth daily.    . furosemide (LASIX) 20 MG tablet Take 20 mg by mouth daily as needed for edema.    Marland Kitchen lisinopril-hydrochlorothiazide (PRINZIDE,ZESTORETIC) 20-25 MG per tablet Take 1 tablet by mouth daily.    . potassium chloride (K-DUR) 10 MEQ tablet Take 10 mEq by mouth daily as needed (when taking Lasix).    . tamoxifen (NOLVADEX) 20 MG tablet Take 20 mg by mouth at bedtime.     . tamoxifen (NOLVADEX) 20 MG tablet TAKE 1 TABLET BY MOUTH DAILY 30 tablet 5   No current facility-administered medications for this visit.     Review of Systems Review of Systems  Constitutional: Negative.   Respiratory: Negative.   Cardiovascular: Negative.     Blood pressure 124/70, pulse 88, resp. rate 14, height 5\' 6"  (1.676 m), weight 235 lb (106.6 kg), last menstrual period 11/30/2012.  Physical Exam Physical Exam  Constitutional: She is oriented to person, place, and time. She appears well-developed and well-nourished.  Eyes: Conjunctivae are normal. No scleral icterus.  Neck: Neck  supple.  Cardiovascular: Normal rate, regular rhythm and normal heart sounds.   Pulmonary/Chest: Effort normal and breath sounds normal. Right breast exhibits no inverted nipple, no mass, no nipple discharge, no skin change and no tenderness. Left breast exhibits no inverted nipple, no mass, no nipple discharge, no skin change and no tenderness.    Right breast incision is clean and well healed.   Abdominal: Soft. Bowel sounds are normal. There is no tenderness.  Lymphadenopathy:    She has no cervical adenopathy.    She has no axillary adenopathy.  Neurological: She is alert and oriented to person, place, and time.  Skin: Skin is  warm and dry.    Data Reviewed 01/20/17 mammograms reviewed. BIRAD 2.  Assessment    Doing well 4 years out of low grade DCIS treatment.     Plan         Patient will be asked to return to the office in one year with a bilateral diagnotic  mammogram.  This information has been scribed by Gaspar Cola CMA.   Robert Bellow 01/31/2017, 9:28 PM

## 2017-05-14 IMAGING — MG MM DIGITAL DIAGNOSTIC BILAT W/ TOMO W/ CAD
8 of 15 series · 8 of 31 positions shown · non-contrast
Comparison: Previous exam(s).

CLINICAL DATA: Annual examination of both breasts. History of right
breast cancer, diagnosed in 0641.The patient is asymptomatic.

EXAM:
2D DIGITAL DIAGNOSTIC BILATERAL MAMMOGRAM WITH CAD AND ADJUNCT TOMO

[L MLO (1 of 2)]
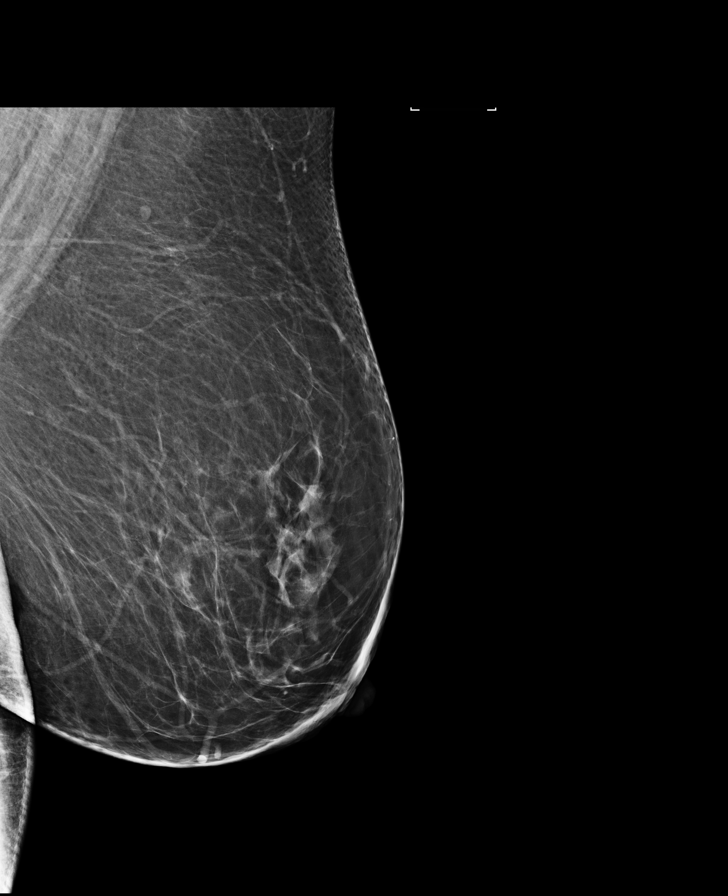

[R XCCL]
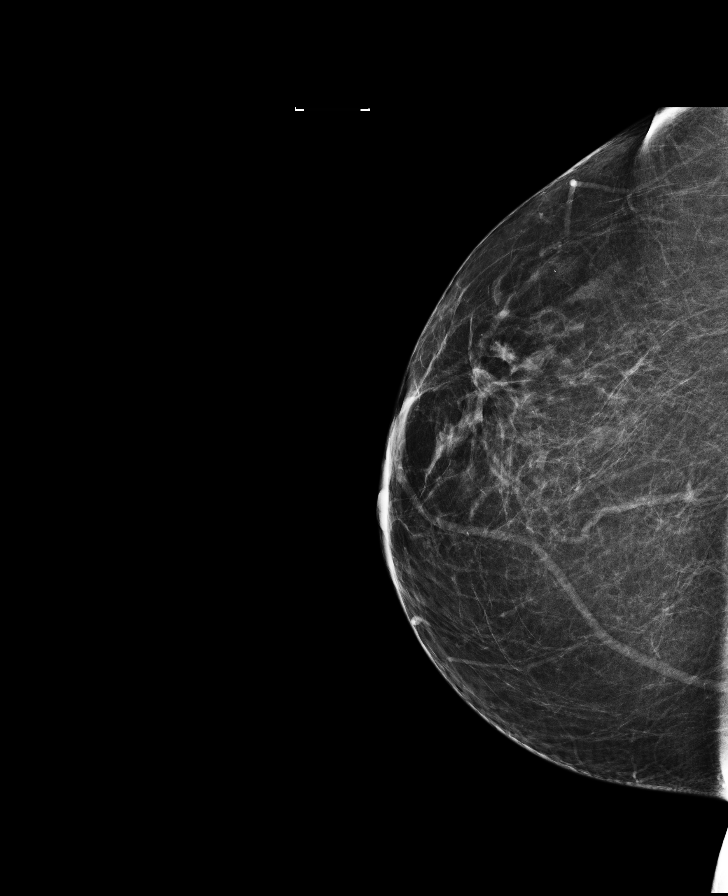

[R CC (1 of 2)]
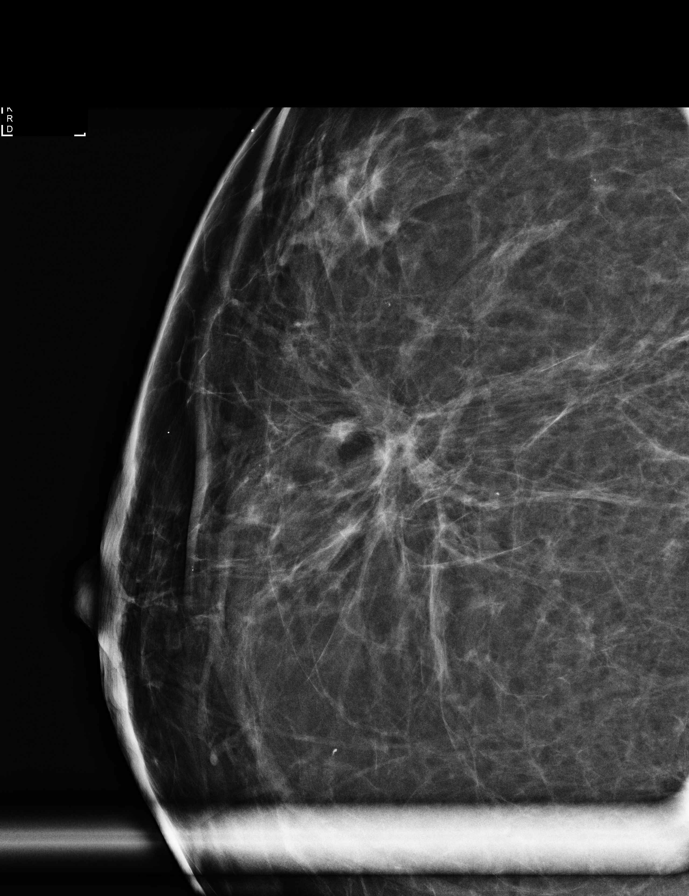

[L MLO synth-2D]
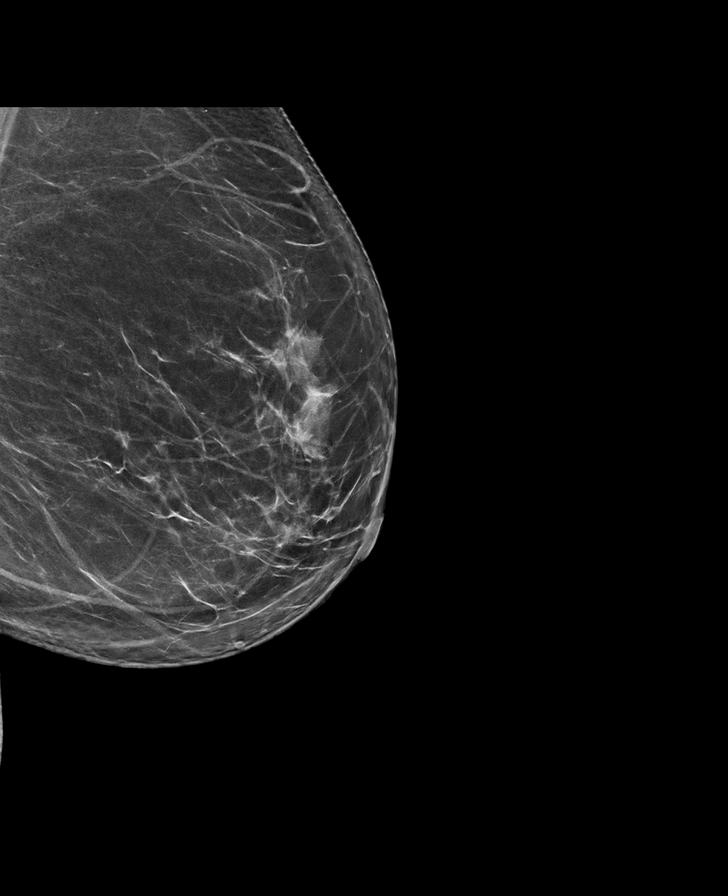

[L MLO (2 of 2)]
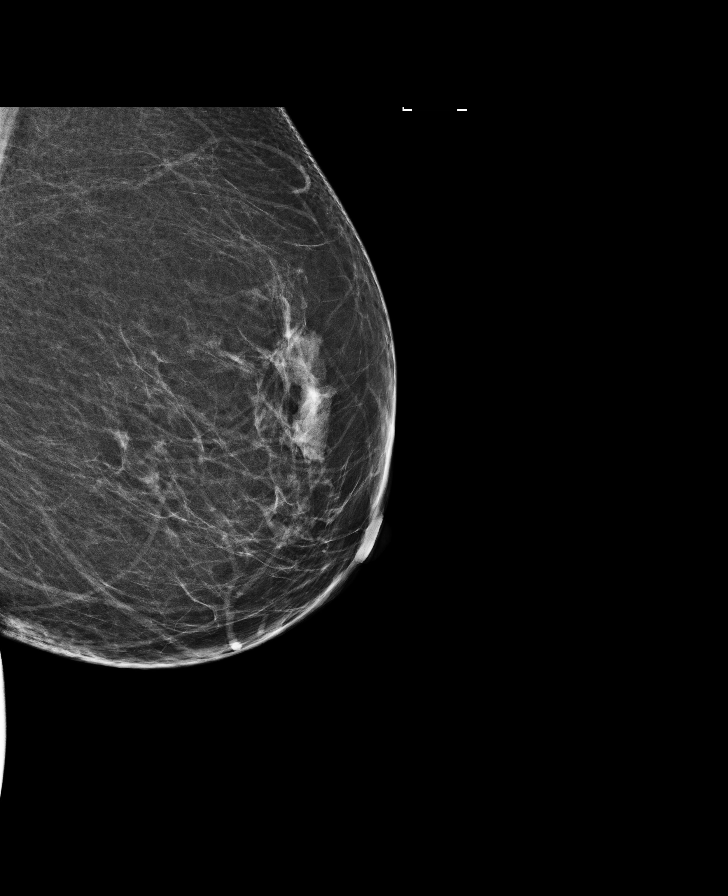

[L CC]
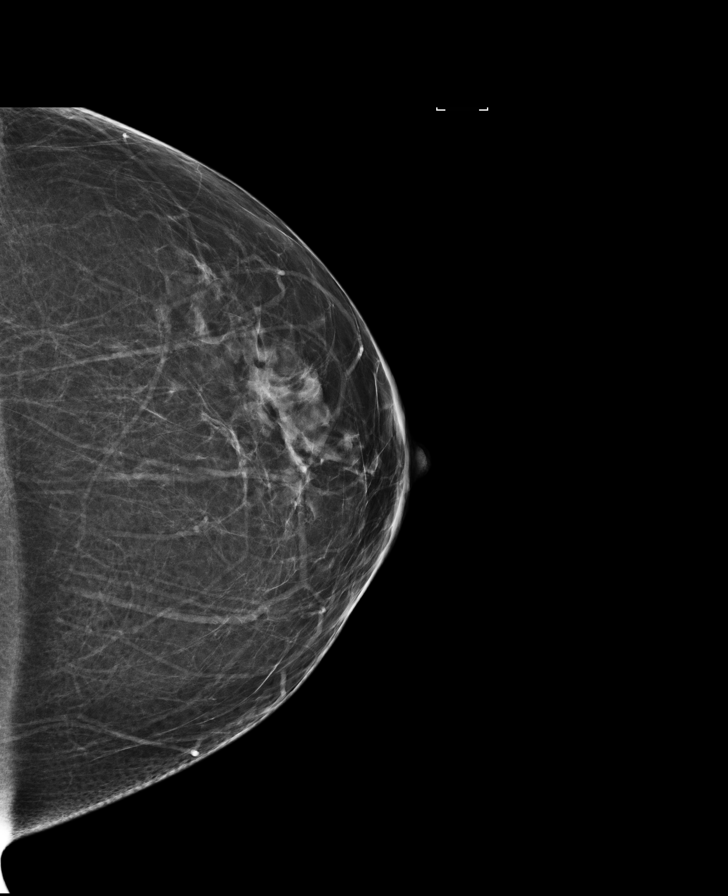

[R CC synth-2D]
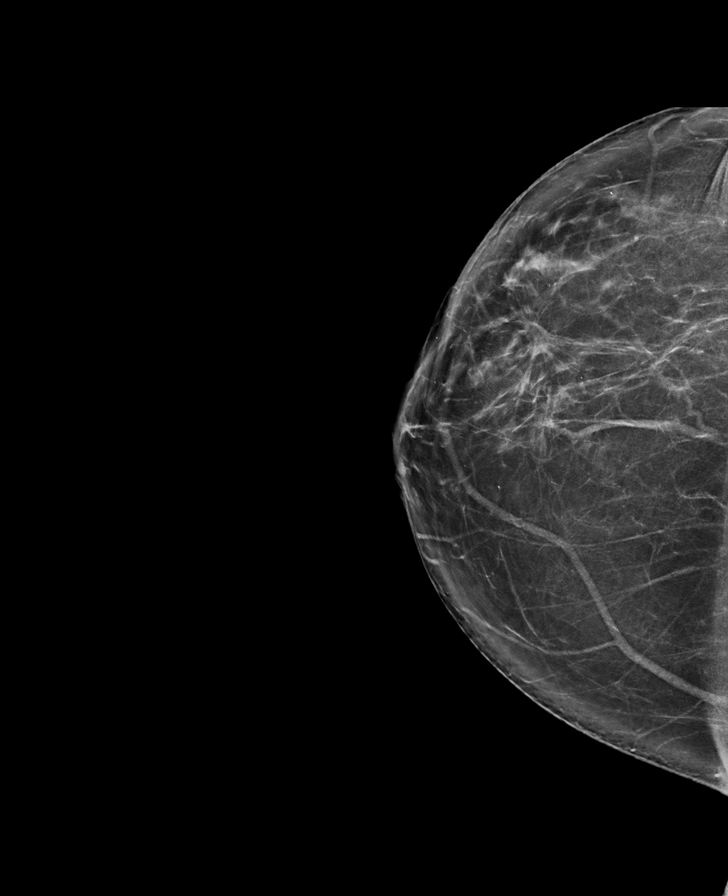

[R CC (2 of 2)]
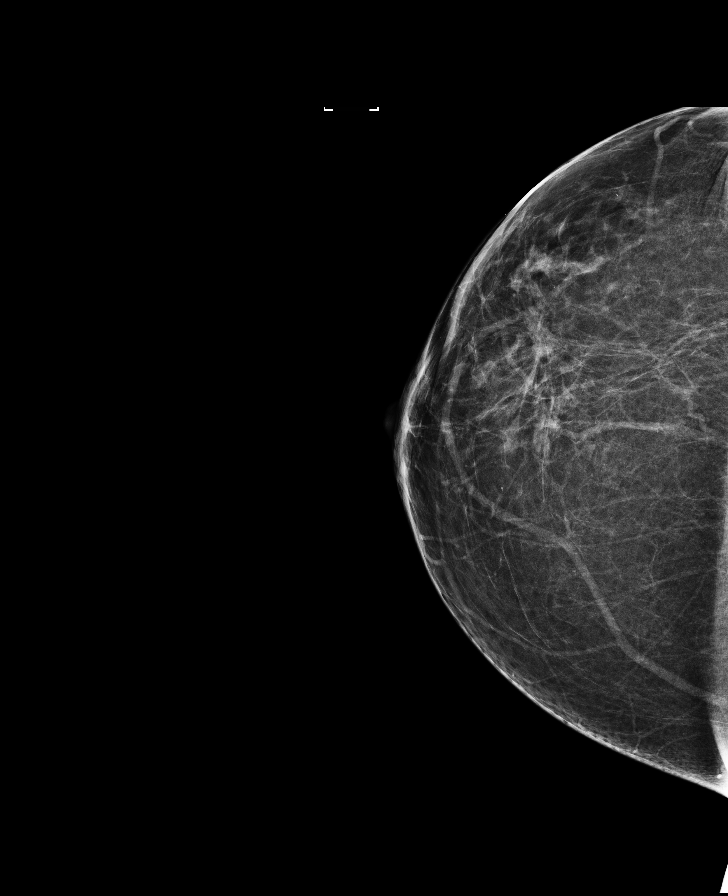

[8 of 31 positions shown; findings below may reference images not displayed]

ACR Breast Density Category b: There are scattered areas of
fibroglandular density.
FINDINGS: There are stable lumpectomy changes in the outer right breast. No
mass, nonsurgical distortion, or suspicious microcalcification is
identified in either breast to suggest malignancy.

Mammographic images were processed with CAD.
IMPRESSION: No evidence of malignancy in either breast. Lumpectomy changes on
the right.

RECOMMENDATION:
Diagnostic mammogram is suggested in 1 year. (Code:UO-2-0O4)

I have discussed the findings and recommendations with the patient.
Results were also provided in writing at the conclusion of the
visit. If applicable, a reminder letter will be sent to the patient
regarding the next appointment.

BI-RADS CATEGORY  2: Benign.

## 2017-05-27 ENCOUNTER — Emergency Department
Admission: EM | Admit: 2017-05-27 | Discharge: 2017-05-28 | Disposition: A | Discharge status: Home / Self Care | Payer: Managed Care, Other (non HMO) | Attending: Emergency Medicine | Admitting: Emergency Medicine

## 2017-05-27 ENCOUNTER — Encounter: Payer: Self-pay | Admitting: Emergency Medicine

## 2017-05-27 DIAGNOSIS — Z853 Personal history of malignant neoplasm of breast: Secondary | ICD-10-CM | POA: Insufficient documentation

## 2017-05-27 DIAGNOSIS — R600 Localized edema: Secondary | ICD-10-CM | POA: Insufficient documentation

## 2017-05-27 DIAGNOSIS — T63461A Toxic effect of venom of wasps, accidental (unintentional), initial encounter: Secondary | ICD-10-CM | POA: Insufficient documentation

## 2017-05-27 DIAGNOSIS — I10 Essential (primary) hypertension: Secondary | ICD-10-CM | POA: Insufficient documentation

## 2017-05-27 DIAGNOSIS — T7840XA Allergy, unspecified, initial encounter: Secondary | ICD-10-CM

## 2017-05-27 MED ORDER — EPINEPHRINE 0.3 MG/0.3ML IJ SOAJ
0.3000 mg | Freq: Once | INTRAMUSCULAR | Status: AC
  Administered 2017-05-28: 0.3 mg via INTRAMUSCULAR
  Filled 2017-05-27: qty 0.3

## 2017-05-27 MED ORDER — FAMOTIDINE IN NACL 20-0.9 MG/50ML-% IV SOLN
20.0000 mg | Freq: Once | INTRAVENOUS | Status: AC
  Administered 2017-05-28: 20 mg via INTRAVENOUS
  Filled 2017-05-27: qty 50

## 2017-05-27 MED ORDER — METHYLPREDNISOLONE SODIUM SUCC 125 MG IJ SOLR
125.0000 mg | Freq: Once | INTRAMUSCULAR | Status: AC
  Administered 2017-05-28: 125 mg via INTRAVENOUS
  Filled 2017-05-27: qty 2

## 2017-05-27 MED ORDER — DIPHENHYDRAMINE HCL 50 MG/ML IJ SOLN
50.0000 mg | Freq: Once | INTRAMUSCULAR | Status: AC
  Administered 2017-05-28: 50 mg via INTRAVENOUS
  Filled 2017-05-27: qty 1

## 2017-05-27 NOTE — ED Triage Notes (Signed)
Patient states that she was stung by a wasp earlier today on her chin. Patient states that she has swelling to her lip that has gone down. Patient denies any shortness of breath but has had some difficulty swallowing water. Patient states that she has taken benadryl.

## 2017-05-28 MED ORDER — DIPHENHYDRAMINE HCL 25 MG PO CAPS: 25.0000 mg | ORAL_CAPSULE | ORAL | 2 refills | Status: DC | PRN

## 2017-05-28 MED ORDER — PREDNISONE 50 MG PO TABS: ORAL_TABLET | ORAL | 0 refills | Status: DC

## 2017-05-28 MED ORDER — EPINEPHRINE 0.3 MG/0.3ML IJ SOAJ: 0.3000 mg | Freq: Once | INTRAMUSCULAR | 1 refills | Status: AC

## 2017-05-28 NOTE — ED Provider Notes (Signed)
[facility] Emergency Department Provider Note       Time seen: ----------------------------------------- 12:03 AM on 05/28/2017 -----------------------------------------     I have reviewed the triage vital signs and the nursing notes.   HISTORY   Chief Complaint Allergic Reaction and Insect Bite    HPI Alyssa Barr is a 56 y.o. female who presents to the ED for an insect bite or sting that occurred earlier today. Patient states initially she had swelling on her lip and face but this has improved. She denies any shortness of breath but has noted some difficulty swallowing and a change in her voice. She did take Benadryl several hours ago. She states she has a history of severe reaction to honey bee staying in the past.   Past Medical History:  Diagnosis Date  . Breast cancer ([place]) 2014   radiation- Right  . Hypertension 2012  . 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 Mammographic microcalcification 12/31/2012   right breast    Patient Active Problem List   Diagnosis Date Noted  . 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 Malignant neoplasm of upper-outer quadrant of female breast [facility])     Past Surgical History:  Procedure Laterality Date  . APPENDECTOMY     age of 57   . BREAST BIOPSY Right 01/03/2013   stereo, low grade DCIS  . BREAST EXCISIONAL BIOPSY Right 01/15/13   positive  . BREAST SURGERY Right 01/10/2013   wide excision, low grade DCIS, ER/PR positive tumor. Post procedure radiation therapy to start 02/11/13  . CESAREAN SECTION  [id number]  . COLONOSCOPY  09/03/2013   [place]  . ENDOMETRIAL BIOPSY  01/07/2014   Barnett Applebaum, M.D. for endometrial prominence and postmenopausal bleeding.    Allergies Sulfa antibiotics  Social History Social History  Substance Use Topics  . Smoking status: Never  Smoker  . Smokeless tobacco: Never Used  . Alcohol use 0.0 oz/week     Comment: ocassionally less than 1 beer weekly    Review of Systems Constitutional: Negative for fever. Eyes: Negative for vision changes ENT:  Positive for difficulty swallowing, changes in her voice Cardiovascular: Negative for chest pain. Respiratory: Negative for shortness of breath. Gastrointestinal: Negative for abdominal pain, vomiting and diarrhea. Musculoskeletal: Negative for back pain. Skin: Positive for skin erythema and edema Neurological: Negative for headaches, focal weakness or numbness.  All systems negative/normal/unremarkable except as stated in the HPI  ____________________________________________   PHYSICAL EXAM:  VITAL SIGNS: ED Triage Vitals [05/27/17 2313]  Enc Vitals Group     BP (!) 162/96     Pulse Rate 100     Resp 18     Temp 98.3 F (36.8 C)     Temp Source Oral     SpO2 100 %     Weight 230 lb (104.3 kg)     Height 5\' 6"  (1.676 m)     Head Circumference      Peak Flow      Pain Score 8     Pain Loc      Pain Edu?      Excl. in [place]?     Constitutional: Alert and oriented. No acute distress Eyes: Conjunctivae are normal. Normal extraocular movements. ENT   Head: Normocephalic and atraumatic.   Nose: No congestion/rhinnorhea.   Mouth/Throat: Mucous membranes are moist. Mild muffling  in her voice is noted. No intraoral or posterior pharyngeal swelling   Neck: No stridor. Anterior neck swelling is noted Cardiovascular: Normal rate, regular rhythm. No murmurs, rubs, or gallops. Respiratory: Normal respiratory effort without tachypnea nor retractions. Breath sounds are clear and equal bilaterally. No wheezes/rales/rhonchi. Gastrointestinal: Soft and nontender. Normal bowel sounds Musculoskeletal: Nontender with normal range of motion in extremities. No lower extremity tenderness nor edema. Neurologic:  Normal speech and language. No gross focal neurologic  deficits are appreciated.  Skin:  Skin erythema is noted throughout the upper chest and neck Psychiatric: Mood and affect are normal. Speech and behavior are normal.  ____________________________________________  ED COURSE:  Pertinent labs & imaging results that were available during my care of the patient were reviewed by me and considered in my medical decision making (see chart for details). Patient presents for allergic reaction,  due to the change in her voice she will require EpiPen, steroids and antihistamines with extended observation Clinical Course as of May 29 239  Sun May 28, 2017  0044 Patient notes improvement in her symptoms after EpiPen  [JW]  0209 Patient continues to have improved symptoms  [JW]    Clinical Course User Index [JW] Earleen Newport, MD   Procedures ____________________________________________  FINAL ASSESSMENT AND PLAN  Allergic reaction  Plan: Patient had presented for an allergic reaction after wasp sting earlier. Initially she had improvement home with Benadryl and ice but had changes in her voice and difficulty swallowing. Here she was given epinephrine as well as standard antihistamines and steroids. She was so school he observed for 4 hours with improvement. She is stable for outpatient follow-up. We will continue to encourage steroids and antihistamines. Earleen Newport, MD   Note: This note was generated in part or whole with voice recognition software. Voice recognition is usually quite accurate but there are transcription errors that can and very often do occur. I apologize for any typographical errors that were not detected and corrected.     Earleen Newport, MD 05/28/17 [phone]

## 2017-05-28 NOTE — ED Notes (Signed)
Pt reports stung by a bee earlier today. Had large amt of swelling to her lower lip and face. Took benadryl at home and it improved. Pt reports over the last few hours she has noticed her voice becoming more hoarse and now is unable to swallow water. Pt is talking in full and complete sentences with noted hoarse sound to her voice at this time.

## 2017-05-28 NOTE — ED Notes (Signed)
Dr Jimmye Norman to bedside.

## 2017-07-05 ENCOUNTER — Other Ambulatory Visit: Payer: Self-pay | Admitting: General Surgery

## 2017-08-07 ENCOUNTER — Other Ambulatory Visit: Payer: Self-pay | Admitting: General Surgery

## 2017-09-05 ENCOUNTER — Other Ambulatory Visit: Payer: Self-pay | Admitting: General Surgery

## 2017-10-05 ENCOUNTER — Other Ambulatory Visit: Payer: Self-pay | Admitting: General Surgery

## 2017-11-14 ENCOUNTER — Other Ambulatory Visit: Payer: Self-pay | Admitting: General Surgery

## 2017-11-23 ENCOUNTER — Other Ambulatory Visit: Payer: Self-pay

## 2017-11-23 DIAGNOSIS — D0511 Intraductal carcinoma in situ of right breast: Secondary | ICD-10-CM

## 2017-12-17 ENCOUNTER — Other Ambulatory Visit: Payer: Self-pay | Admitting: General Surgery

## 2017-12-28 ENCOUNTER — Ambulatory Visit
Admission: RE | Admit: 2017-12-28 | Discharge: 2017-12-28 | Disposition: A | Discharge status: Home / Self Care | Payer: Managed Care, Other (non HMO) | Source: Ambulatory Visit | Attending: Radiation Oncology | Admitting: Radiation Oncology

## 2017-12-28 ENCOUNTER — Other Ambulatory Visit: Payer: Self-pay

## 2017-12-28 ENCOUNTER — Encounter: Payer: Self-pay | Admitting: Radiation Oncology

## 2017-12-28 VITALS — BP 133/79 | HR 105 | Temp 98.5°F | Resp 20 | Wt 232.0 lb

## 2017-12-28 DIAGNOSIS — D0511 Intraductal carcinoma in situ of right breast: Secondary | ICD-10-CM

## 2017-12-28 NOTE — Progress Notes (Signed)
Radiation Oncology Follow up Note  Name: Alyssa Barr   Date:   12/28/2017 MRN:  [id number] DOB: Apr 09, 1961    This 57 y.o. female presents to the clinic today for 4.5 year follow-up status post whole breast radiation to her right breast for ER/PR positive ductal carcinoma in situ.  REFERRING PROVIDER: [place] Pink, MD  HPI: Patient is a 57 year old female now seen out 4.5 years have included whole breast radiation to her right breast for ER/PR positive ductal carcinoma in situ. Seen today in routine follow-up continues to do well. Currently on tamoxifen tolerating that well without side effect. She specifically denies breast tenderness cough or bone pain. Her last mammogram. Was back in February was BI-RADS 2 benign which I have reviewed. She is a rescheduled for follow-up mammogram next month.  COMPLICATIONS OF TREATMENT: none  FOLLOW UP COMPLIANCE: keeps appointments   PHYSICAL EXAM:  BP 133/79   Pulse (!) 105   Temp 98.5 F (36.9 C)   Resp 20   Wt 232 lb 0.6 oz (105.3 kg)   LMP 11/30/2012   BMI 37.45 kg/m  Lungs are clear to A&P cardiac examination essentially unremarkable with regular rate and rhythm. No dominant mass or nodularity is noted in either breast in 2 positions examined. Incision is well-healed. No axillary or supraclavicular adenopathy is appreciated. Cosmetic result is excellent. She does have some slight telangiectatic changes of the skin in her high-dose region. Well-developed well-nourished patient in NAD. HEENT reveals PERLA, EOMI, discs not visualized.  Oral cavity is clear. No oral mucosal lesions are identified. Neck is clear without evidence of cervical or supraclavicular adenopathy. Lungs are clear to A&P. Cardiac examination is essentially unremarkable with regular rate and rhythm without murmur rub or thrill. Abdomen is benign with no organomegaly or masses noted. Motor sensory and DTR levels are equal and symmetric in the upper and lower extremities.  Cranial nerves II through XII are grossly intact. Proprioception is intact. No peripheral adenopathy or edema is identified. No motor or sensory levels are noted. Crude visual fields are within normal range.  RADIOLOGY RESULTS: Mammograms are reviewed and compatible above-stated findings  PLAN: Present time she continues to do well with no evidence of disease. At this point were close to 5 years out I'm going to discontinue follow-up care. Patient knows to call at any time for any concerns. She is a rescheduled for follow-up mammograms. Surgeon will decide on duration of her tamoxifen therapy.  I would like to take this opportunity to thank you for allowing me to participate in the care of your patient.Noreene Filbert, MD

## 2018-01-23 ENCOUNTER — Ambulatory Visit
Admission: RE | Admit: 2018-01-23 | Discharge: 2018-01-23 | Disposition: A | Discharge status: Home / Self Care | Payer: Managed Care, Other (non HMO) | Source: Ambulatory Visit | Attending: General Surgery | Admitting: General Surgery

## 2018-01-23 DIAGNOSIS — D0511 Intraductal carcinoma in situ of right breast: Secondary | ICD-10-CM

## 2018-01-23 DIAGNOSIS — Z1231 Encounter for screening mammogram for malignant neoplasm of breast: Secondary | ICD-10-CM | POA: Diagnosis not present

## 2018-02-01 ENCOUNTER — Ambulatory Visit: Payer: Managed Care, Other (non HMO) | Admitting: General Surgery

## 2018-02-01 ENCOUNTER — Encounter: Payer: Self-pay | Admitting: General Surgery

## 2018-02-01 VITALS — BP 132/68 | HR 92 | Resp 16 | Ht 66.0 in | Wt 235.0 lb

## 2018-02-01 DIAGNOSIS — D0511 Intraductal carcinoma in situ of right breast: Secondary | ICD-10-CM | POA: Diagnosis not present

## 2018-02-01 NOTE — Patient Instructions (Addendum)
The patient is aware to call back for any questions or concerns. The patient has been asked to return to the office in one year with a bilateral diagnostic mammogram. Complete tamoxifen that she has on hand.

## 2018-02-01 NOTE — Progress Notes (Signed)
Patient ID: Alyssa Barr, female   DOB: 1961/06/28, 57 y.o.   MRN: [id number]  Chief Complaint  Patient presents with  . Follow-up    HPI Alyssa Barr is a 57 y.o. female.  who presents for her follow up right breast cancer and a breast evaluation. The most recent mammogram was done on 01-23-18.  Patient does perform regular self breast checks and gets regular mammograms done.   Tolerating her tamoxifen.  HPI  Past Medical History:  Diagnosis Date  . Breast cancer ([place]) 2014   radiation- Right  . Hypertension 2012  . 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  Posterior margin less than 1 mm.  . Mammographic microcalcification 12/31/2012   right breast    Past Surgical History:  Procedure Laterality Date  . APPENDECTOMY     age of 60   . BREAST BIOPSY Right 01/03/2013   stereo, low grade DCIS  . BREAST EXCISIONAL BIOPSY Right 01/15/13   positive  . BREAST LUMPECTOMY Right 2014   with radiation  . BREAST SURGERY Right 01/10/2013   wide excision, low grade DCIS, ER/PR positive tumor. Post procedure radiation therapy to start 02/11/13  . CESAREAN SECTION  [id number]  . COLONOSCOPY  09/03/2013   Alyssa Barr  . ENDOMETRIAL BIOPSY  01/07/2014   Alyssa Barr, M.D. for endometrial prominence and postmenopausal bleeding.    Family History  Problem Relation Age of Onset  . Cancer Cousin 50       breast cancer in late 63's  . Breast cancer Cousin   . Cancer Maternal Grandmother 80       Blastomycosis of the jaw-late 80's  . Cancer Paternal Grandfather 63       colon cancer in late 16's    Social History Social History   Tobacco Use  . Smoking status: Never Smoker  . Smokeless tobacco: Never Used  Substance Use Topics  . Alcohol use: Yes    Alcohol/week: 0.0 oz    Comment: ocassionally less than 1 beer weekly  . Drug use: No    Allergies  Allergen  Reactions  . Wasp Venom Anaphylaxis  . Sulfa Antibiotics Itching, Swelling, Rash and Other (See Comments)    Reaction:  Hand swelling     Current Outpatient Medications  Medication Sig Dispense Refill  . B Complex-C (B-COMPLEX WITH VITAMIN C) tablet Take 1 tablet by mouth daily.    . cholecalciferol (VITAMIN D) 1000 units tablet Take 2,000 Units by mouth daily.    Marland Kitchen EPINEPHrine 0.3 mg/0.3 mL IJ SOAJ injection     . lisinopril-hydrochlorothiazide (PRINZIDE,ZESTORETIC) 20-25 MG per tablet Take 1 tablet by mouth daily.    . tamoxifen (NOLVADEX) 20 MG tablet TAKE 1 TABLET BY MOUTH DAILY 90 tablet 3  . furosemide (LASIX) 20 MG tablet Take 20 mg by mouth daily as needed for edema.    . potassium chloride (K-DUR) 10 MEQ tablet Take 10 mEq by mouth daily as needed (when taking Lasix).     No current facility-administered medications for this visit.     Review of Systems Review of Systems  Constitutional: Negative.   Respiratory: Negative.   Cardiovascular: Negative.     Blood pressure 132/68, pulse 92, resp. rate 16, height 5\' 6"  (1.676 m), weight 235 lb (106.6 kg), last menstrual period 11/30/2012.  Physical Exam Physical Exam  Constitutional: She is oriented to  person, place, and time. She appears well-developed and well-nourished.  HENT:  Mouth/Throat: Oropharynx is clear and moist.  Eyes: Conjunctivae are normal. No scleral icterus.  Neck: Neck supple.  Cardiovascular: Normal rate, regular rhythm and normal heart sounds.  Pulmonary/Chest: Effort normal and breath sounds normal. Right breast exhibits no inverted nipple, no mass, no nipple discharge, no skin change and no tenderness. Left breast exhibits no inverted nipple, no mass, no nipple discharge, no skin change and no tenderness.    Right breast incision well healed.  Lymphadenopathy:    She has no cervical adenopathy.    She has no axillary adenopathy.  Neurological: She is alert and oriented to person, place, and time.   Skin: Skin is warm and dry.  Psychiatric: Her behavior is normal.    Data Reviewed January 23, 2018 bilateral diagnostic mammograms reviewed.  Postsurgical changes.  BI-RADS-2.  Assessment    Doing well now 5 years after management of low-grade DCIS by wide excision, whole breast radiation and tamoxifen.    Plan    Complete tamoxifen that she has on hand. The patient has been asked to return to the office in one year with a bilateral diagnostic mammogram.     HPI, Physical Exam, Assessment and Plan have been scribed under the direction and in the presence of Alyssa Bellow, MD. Alyssa Fetch, RN  I have completed the exam and reviewed the above documentation for accuracy and completeness.  I agree with the above.  Haematologist has been used and any errors in dictation or transcription are unintentional.  Alyssa Barr, M.D., F.A.C.S.  Alyssa Barr Alyssa Barr 02/01/2018, 2:00 PM

## 2018-12-11 ENCOUNTER — Other Ambulatory Visit: Payer: Self-pay

## 2018-12-11 DIAGNOSIS — D0511 Intraductal carcinoma in situ of right breast: Secondary | ICD-10-CM

## 2018-12-11 DIAGNOSIS — Z1231 Encounter for screening mammogram for malignant neoplasm of breast: Secondary | ICD-10-CM

## 2018-12-18 NOTE — Addendum Note (Signed)
Addended by: Lesly Rubenstein on: 12/18/2018 10:21 AM   Modules accepted: Orders

## 2019-01-24 ENCOUNTER — Ambulatory Visit
Admission: RE | Admit: 2019-01-24 | Discharge: 2019-01-24 | Disposition: A | Discharge status: Home / Self Care | Payer: Managed Care, Other (non HMO) | Source: Ambulatory Visit | Attending: Surgery | Admitting: Surgery

## 2019-01-24 DIAGNOSIS — Z1231 Encounter for screening mammogram for malignant neoplasm of breast: Secondary | ICD-10-CM | POA: Diagnosis present

## 2019-01-29 ENCOUNTER — Other Ambulatory Visit: Payer: Self-pay

## 2019-01-29 ENCOUNTER — Encounter: Payer: Self-pay | Admitting: General Surgery

## 2019-01-29 ENCOUNTER — Ambulatory Visit: Payer: Managed Care, Other (non HMO) | Admitting: General Surgery

## 2019-01-29 VITALS — BP 154/79 | HR 87 | Temp 97.5°F | Resp 16 | Ht 66.0 in | Wt 235.0 lb

## 2019-01-29 DIAGNOSIS — D0511 Intraductal carcinoma in situ of right breast: Secondary | ICD-10-CM | POA: Diagnosis not present

## 2019-01-29 DIAGNOSIS — C50411 Malignant neoplasm of upper-outer quadrant of right female breast: Secondary | ICD-10-CM | POA: Diagnosis not present

## 2019-01-29 NOTE — Progress Notes (Signed)
Patient ID: Alyssa Barr, female   DOB: 1961-10-30, 57 y.o.   MRN: [id number]  Chief Complaint  Patient presents with  . Follow-up    HPI Alyssa Barr is a 58 y.o. female.  who presents for her follow up right breast cancer and a breast evaluation. The most recent mammogram was done on 01-24-19.  Patient does perform regular self breast checks and gets regular mammograms done.   Since she has completed the Tamoxifen she no longer has leg cramps or mood swings.  HPI  Past Medical History:  Diagnosis Date  . Breast cancer ([place]) 2014   radiation- Right  . Hypertension 2012  . 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  Posterior margin less than 1 mm.  . Mammographic microcalcification 12/31/2012   right breast    Past Surgical History:  Procedure Laterality Date  . APPENDECTOMY     age of 66   . BREAST BIOPSY Right 01/03/2013   stereo, low grade DCIS  . BREAST EXCISIONAL BIOPSY Right 01/15/13   positive  . BREAST LUMPECTOMY Right 2014   with radiation  . BREAST SURGERY Right 01/10/2013   wide excision, low grade DCIS, ER/PR positive tumor. Post procedure radiation therapy to start 02/11/13  . CESAREAN SECTION  [id number]  . COLONOSCOPY  09/03/2013   Alyssa Barr  . ENDOMETRIAL BIOPSY  01/07/2014   Barnett Applebaum, M.D. for endometrial prominence and postmenopausal bleeding.    Family History  Problem Relation Age of Onset  . Cancer Cousin 50       breast cancer in late 38's  . Breast cancer Cousin   . Cancer Maternal Grandmother 80       Blastomycosis of the jaw-late 80's  . Cancer Paternal Grandfather 30       colon cancer in late 20's    Social History Social History   Tobacco Use  . Smoking status: Never Smoker  . Smokeless tobacco: Never Used  Substance Use Topics  . Alcohol use: Yes    Comment: ocassionally less than 1 beer weekly  . Drug  use: No    Allergies  Allergen Reactions  . Wasp Venom Anaphylaxis  . Sulfa Antibiotics Itching, Swelling, Rash and Other (See Comments)    Reaction:  Hand swelling     Current Outpatient Medications  Medication Sig Dispense Refill  . B Complex-C (B-COMPLEX WITH VITAMIN C) tablet Take 1 tablet by mouth daily.    . cholecalciferol (VITAMIN D) 1000 units tablet Take 2,000 Units by mouth daily.    Marland Kitchen EPINEPHrine 0.3 mg/0.3 mL IJ SOAJ injection as needed.     . furosemide (LASIX) 20 MG tablet Take 20 mg by mouth daily as needed for edema.    Marland Kitchen lisinopril-hydrochlorothiazide (PRINZIDE,ZESTORETIC) 20-25 MG per tablet Take 1 tablet by mouth daily.    . potassium chloride (K-DUR) 10 MEQ tablet Take 10 mEq by mouth daily as needed (when taking Lasix).     No current facility-administered medications for this visit.     Review of Systems Review of Systems  Constitutional: Negative.   Respiratory: Negative.   Cardiovascular: Negative.     Blood pressure (!) 154/79, pulse 87, temperature (!) 97.5 F (36.4 C), temperature source Temporal, resp. rate 16, height 5\' 6"  (1.676 m), weight 235 lb (106.6 kg), last menstrual period 11/30/2012, SpO2 95 %.  Physical Exam Physical Exam Constitutional:  Appearance: She is well-developed.  Eyes:     General: No scleral icterus.    Conjunctiva/sclera: Conjunctivae normal.  Neck:     Musculoskeletal: Neck supple.  Cardiovascular:     Rate and Rhythm: Normal rate and regular rhythm.     Heart sounds: Normal heart sounds.  Pulmonary:     Effort: Pulmonary effort is normal.     Breath sounds: Normal breath sounds.  Chest:     Breasts:        Right: No inverted nipple, mass, nipple discharge, skin change or tenderness.        Left: No inverted nipple, mass, nipple discharge, skin change or tenderness.    Lymphadenopathy:     Cervical: No cervical adenopathy.     Upper Body:     Right upper body: No supraclavicular or axillary adenopathy.      Left upper body: No supraclavicular or axillary adenopathy.  Skin:    General: Skin is warm and dry.  Neurological:     Mental Status: She is alert and oriented to person, place, and time.     Data Reviewed Bilateral screening mammograms dated January 24, 2019 were reviewed.  Essentially fatty replaced breast.  Scant evidence of scarring from previous surgery and radiation.  BI-RADS-1.  Assessment    No evidence of recurrent cancer.    Plan The patient has been asked to return to the office in one year with a bilateral screening mammogram.The patient is aware to call back for any questions or concerns.   HPI, Physical Exam, Assessment and Plan have been scribed under the direction and in the presence of Hervey Ard, MD.  Gaspar Cola, CMA  HPI, assessment, plan and physical exam has been scribed under the direction and in the presence of Robert Bellow, MD. Karie Fetch, RN  I have completed the exam and reviewed the above documentation for accuracy and completeness.  I agree with the above.  Haematologist has been used and any errors in dictation or transcription are unintentional.  Hervey Ard, M.D., F.A.C.S.  Forest Gleason Vian Fluegel 01/29/2019, 10:22 AM

## 2019-01-29 NOTE — Patient Instructions (Addendum)
The patient has been asked to return to the office in one year with a bilateral screeningmammogram.The patient is aware to call back for any questions or concerns.  

## 2019-04-09 ENCOUNTER — Telehealth: Payer: Self-pay | Admitting: *Deleted

## 2019-04-09 NOTE — Telephone Encounter (Signed)
Mary from [facility] left message on triage line stating that based off new literture she sent a new report for [facility] that shows updated negative results. Stanton Kidney states information has been faxed to our office.

## 2019-04-10 NOTE — Telephone Encounter (Signed)
I don't think I've seen this patient and I don't see that she has a medical oncologist. I do see that she has seen Dr. Baruch Gouty and Dr. Bary Castilla so I'll forward this message to them.

## 2019-07-09 ENCOUNTER — Encounter: Payer: Self-pay | Admitting: General Surgery

## 2019-11-28 ENCOUNTER — Other Ambulatory Visit: Payer: Self-pay | Admitting: General Surgery

## 2019-11-28 DIAGNOSIS — Z1231 Encounter for screening mammogram for malignant neoplasm of breast: Secondary | ICD-10-CM

## 2019-12-02 ENCOUNTER — Other Ambulatory Visit: Payer: Self-pay | Admitting: General Surgery

## 2019-12-02 DIAGNOSIS — Z1231 Encounter for screening mammogram for malignant neoplasm of breast: Secondary | ICD-10-CM

## 2020-01-29 ENCOUNTER — Ambulatory Visit
Admission: RE | Admit: 2020-01-29 | Discharge: 2020-01-29 | Disposition: A | Discharge status: Home / Self Care | Payer: Managed Care, Other (non HMO) | Source: Ambulatory Visit | Attending: General Surgery | Admitting: General Surgery

## 2020-01-29 DIAGNOSIS — Z1231 Encounter for screening mammogram for malignant neoplasm of breast: Secondary | ICD-10-CM | POA: Diagnosis not present

## 2020-01-29 HISTORY — DX: Personal history of irradiation: Z92.3

## 2020-01-30 ENCOUNTER — Other Ambulatory Visit: Payer: Self-pay | Admitting: General Surgery

## 2020-01-30 DIAGNOSIS — R928 Other abnormal and inconclusive findings on diagnostic imaging of breast: Secondary | ICD-10-CM

## 2020-02-10 ENCOUNTER — Ambulatory Visit
Admission: RE | Admit: 2020-02-10 | Discharge: 2020-02-10 | Disposition: A | Discharge status: Home / Self Care | Payer: Managed Care, Other (non HMO) | Source: Ambulatory Visit | Attending: General Surgery | Admitting: General Surgery

## 2020-02-10 DIAGNOSIS — R928 Other abnormal and inconclusive findings on diagnostic imaging of breast: Secondary | ICD-10-CM

## 2021-01-22 ENCOUNTER — Other Ambulatory Visit: Payer: Self-pay | Admitting: General Surgery

## 2021-01-22 DIAGNOSIS — Z1231 Encounter for screening mammogram for malignant neoplasm of breast: Secondary | ICD-10-CM

## 2021-02-11 ENCOUNTER — Other Ambulatory Visit: Payer: Self-pay

## 2021-02-11 ENCOUNTER — Ambulatory Visit
Admission: RE | Admit: 2021-02-11 | Discharge: 2021-02-11 | Disposition: A | Discharge status: Home / Self Care | Payer: Managed Care, Other (non HMO) | Source: Ambulatory Visit | Attending: General Surgery | Admitting: General Surgery

## 2021-02-11 DIAGNOSIS — Z1231 Encounter for screening mammogram for malignant neoplasm of breast: Secondary | ICD-10-CM | POA: Diagnosis present

## 2022-01-24 ENCOUNTER — Other Ambulatory Visit: Payer: Self-pay | Admitting: General Surgery

## 2022-01-24 DIAGNOSIS — Z1231 Encounter for screening mammogram for malignant neoplasm of breast: Secondary | ICD-10-CM

## 2022-03-07 ENCOUNTER — Ambulatory Visit
Admission: RE | Admit: 2022-03-07 | Discharge: 2022-03-07 | Disposition: A | Discharge status: Home / Self Care | Payer: Managed Care, Other (non HMO) | Source: Ambulatory Visit | Attending: General Surgery | Admitting: General Surgery

## 2022-03-07 ENCOUNTER — Other Ambulatory Visit: Payer: Self-pay

## 2022-03-07 DIAGNOSIS — Z1231 Encounter for screening mammogram for malignant neoplasm of breast: Secondary | ICD-10-CM | POA: Insufficient documentation

## 2022-11-18 ENCOUNTER — Encounter: Payer: Self-pay | Admitting: Internal Medicine

## 2022-11-18 ENCOUNTER — Ambulatory Visit: Payer: Managed Care, Other (non HMO) | Attending: Internal Medicine | Admitting: Internal Medicine

## 2022-11-18 VITALS — BP 138/83 | HR 88 | Ht 65.0 in | Wt 248.4 lb

## 2022-11-18 DIAGNOSIS — R079 Chest pain, unspecified: Secondary | ICD-10-CM

## 2022-11-18 DIAGNOSIS — R0602 Shortness of breath: Secondary | ICD-10-CM

## 2022-11-18 DIAGNOSIS — Z8249 Family history of ischemic heart disease and other diseases of the circulatory system: Secondary | ICD-10-CM

## 2022-11-18 DIAGNOSIS — I10 Essential (primary) hypertension: Secondary | ICD-10-CM

## 2022-11-18 MED ORDER — METOPROLOL TARTRATE 100 MG PO TABS: 100.0000 mg | ORAL_TABLET | ORAL | 0 refills | Status: DC

## 2022-11-18 NOTE — Patient Instructions (Signed)
Medication Instructions:  NO CHANGES  Take a one-time dose of metoprolol tartrate '100mg'$  - two hours before CT test  *If you need a refill on your cardiac medications before your next appointment, please call your pharmacy*   Lab Work: Non-Fasting BMET prior to CT test  If you have labs (blood work) drawn today and your tests are completely normal, you will receive your results only by: [place] (if you have MyChart) OR A paper copy in the mail If you have any lab test that is abnormal or we need to change your treatment, we will call you to review the results.   Testing/Procedures: Coronary CT test -- once approved with insurance you will get a call to schedule the study    Follow-Up: At [facility] you and your health needs are our priority.  As part of our continuing mission to provide you with exceptional heart care, we have created designated Provider Care Teams.  These Care Teams include your primary Cardiologist (physician) and Advanced Practice Providers (APPs -  Physician Assistants and Nurse Practitioners) who all work together to provide you with the care you need, when you need it.  We recommend signing up for the patient portal called "MyChart".  Sign up information is provided on this After Visit Summary.  MyChart is used to connect with patients for Virtual Visits (Telemedicine).  Patients are able to view lab/test results, encounter notes, upcoming appointments, etc.  Non-urgent messages can be sent to your provider as well.   To learn more about what you can do with MyChart, go to [email or web address].    Your next appointment:    6-8 weeks with Dr. Nicanor Bake  in-person or video visit Other Instructions   Your cardiac CT will be scheduled at one of the below locations:   [facility] [address] [place], [place] [address] [phone]  [place] [address] [place], [place] [address] [phone]  [facility] [place],  [address] [phone]  If scheduled at [facility], please arrive at the [facility] and Children's Entrance (Entrance C2) of [facility] 30 minutes prior to test start time. You can use the FREE valet parking offered at entrance C (encouraged to control the heart rate for the test)  Proceed to the [facility] Radiology Department (first floor) to check-in and test prep.  All radiology patients and guests should use entrance C2 at [facility], accessed from [place], even though the hospital's physical address listed is [address]    If scheduled at [facility] or [facility], please arrive 15 mins early for check-in and test prep.   Please follow these instructions carefully (unless otherwise directed):  Hold all erectile dysfunction medications at least 3 days (72 hrs) prior to test. (Ie viagra, cialis, sildenafil, tadalafil, etc) We will administer nitroglycerin during this exam.   On the Night Before the Test: Be sure to Drink plenty of water. Do not consume any caffeinated/decaffeinated beverages or chocolate 12 hours prior to your test. Do not take any antihistamines 12 hours prior to your test.  On the Day of the Test: Drink plenty of water until 1 hour prior to the test. Do not eat any food 1 hour prior to test. You may take your regular medications prior to the test.  Take metoprolol (Lopressor) two hours  prior to test. HOLD Furosemide/Hydrochlorothiazide morning of the test. FEMALES- please wear underwire-free bra if available, avoid dresses & tight clothing   After the Test: Drink plenty of water. After receiving IV contrast, you may experience a mild flushed feeling. This is normal. On occasion, you may experience a mild rash up to 24 hours after the test.  This is not dangerous. If this occurs, you can take Benadryl 25 mg and increase your fluid intake. If you experience trouble breathing, this can be serious. If it is severe call 911 IMMEDIATELY. If it is mild, please call our office. If you take any of these medications: Glipizide/Metformin, Avandament, Glucavance, please do not take 48 hours after completing test unless otherwise instructed.  We will call to schedule your test 2-4 weeks out understanding that some insurance companies will need an authorization prior to the service being performed.   For non-scheduling related questions, please contact the cardiac imaging nurse navigator should you have any questions/concerns: Marchia Bond, Cardiac Imaging Nurse Navigator Gordy Clement, Cardiac Imaging Nurse Navigator [place] [facility] Direct Office Dial: [phone]   For scheduling needs, including cancellations and rescheduling, please call [place], [phone].

## 2022-11-19 ENCOUNTER — Other Ambulatory Visit: Payer: Self-pay | Admitting: Internal Medicine

## 2022-11-20 ENCOUNTER — Other Ambulatory Visit: Payer: Self-pay | Admitting: Internal Medicine

## 2022-11-20 NOTE — Progress Notes (Signed)
OFFICE CONSULT NOTE  Chief Complaint:  DOE, chest pain  Primary Care Physician: Alyssa Pink, MD  HPI:  Alyssa Barr is a 61 y.o. female who is being seen today for the evaluation of DOE at the request of Alyssa Pink, MD. This is a 61 yo female with a history of remote breast cancer in 2014, without recurrence and hypertension, as well as a family history of coronary artery disease in her mother, brother and high cholesterol as well.  She presents for evaluation of progressive shortness of breath with exertion.  She notes that she has been helping her husband take care of her mother-in-law who has been placed into nursing care in [place].  She was out there visiting and had an episode of discomfort in her chest which radiated to her back and between her shoulder blades.  It was intense but seem to improve when sitting up versus lying down.  She took soft ring to try to get her to burp but had minimal relief with this.  She thought possibly it was related to altitude or it was a "fluke".  Ultimately this resolved and she has not had recurrence since she returned to [place].  She denies any significant exertional shortness of breath and has been recently exercising.  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  Posterior margin less than 1 mm.   Mammographic microcalcification 12/31/2012   right breast   Personal history of radiation therapy     Past Surgical History:  Procedure Laterality Date   APPENDECTOMY     age of 48    BREAST BIOPSY Right 01/03/2013   stereo, low grade DCIS   BREAST EXCISIONAL BIOPSY Right 01/15/13   positive   BREAST LUMPECTOMY Right 2014   with radiation   BREAST SURGERY Right 01/10/2013   wide excision, low  grade DCIS, ER/PR positive tumor. Post procedure radiation therapy to start 02/11/13   CESAREAN SECTION  1610,9604   COLONOSCOPY  09/03/2013   [facility]   ENDOMETRIAL BIOPSY  01/07/2014   Barnett Applebaum, M.D. for endometrial prominence and postmenopausal bleeding.    FAMHx:  Family History  Problem Relation Age of Onset   Cancer Cousin 65       breast cancer in late 50's   Breast cancer Cousin    Cancer Maternal Grandmother 80       Blastomycosis of the jaw-late 80's   Cancer Paternal Grandfather 5       colon cancer in late 62's    SOCHx:   reports that she has never smoked. She has never used smokeless tobacco. She reports current alcohol use. She reports that she does not use drugs.  ALLERGIES:  Allergies  Allergen Reactions   Wasp Venom Anaphylaxis   Sulfa Antibiotics Itching, Swelling, Rash and Other (See Comments)    Reaction:  Hand swelling     ROS: Pertinent items noted in HPI and remainder of comprehensive ROS otherwise negative.  HOME MEDS: Current Outpatient Medications on File Prior to Visit  Medication Sig Dispense Refill   B Complex-C (B-COMPLEX WITH VITAMIN C) tablet Take 1 tablet by mouth daily.     cholecalciferol (VITAMIN D) 1000 units tablet Take 2,000 Units by mouth daily.  EPINEPHrine 0.3 mg/0.3 mL IJ SOAJ injection as needed.      lisinopril-hydrochlorothiazide (PRINZIDE,ZESTORETIC) 20-25 MG per tablet Take 1 tablet by mouth daily.     furosemide (LASIX) 20 MG tablet Take 20 mg by mouth daily as needed for edema. (Patient not taking: Reported on 11/18/2022)     potassium chloride (K-DUR) 10 MEQ tablet Take 10 mEq by mouth daily as needed (when taking Lasix). (Patient not taking: Reported on 11/18/2022)     No current facility-administered medications on file prior to visit.    LABS/IMAGING: No results found for this or any previous visit (from the past 48 hour(s)). No results found.  LIPID PANEL: No results found for: "CHOL", "TRIG", "HDL",  "CHOLHDL", "VLDL", "LDLCALC", "LDLDIRECT"  WEIGHTS: Wt Readings from Last 3 Encounters:  11/18/22 248 lb 6.4 oz (112.7 kg)  01/29/19 235 lb (106.6 kg)  02/01/18 235 lb (106.6 kg)    VITALS: BP 138/83   Pulse 88   Ht '5\' 5"'$  (1.651 m)   Wt 248 lb 6.4 oz (112.7 kg)   LMP 11/30/2012   SpO2 97%   BMI 41.34 kg/m   EXAM: General appearance: alert, no distress, and morbidly obese Neck: no carotid bruit, no JVD, and thyroid not enlarged, symmetric, no tenderness/mass/nodules Lungs: clear to auscultation bilaterally Heart: regular rate and rhythm, S1, S2 normal, no murmur, click, rub or gallop Abdomen: soft, non-tender; bowel sounds normal; no masses,  no organomegaly Extremities: extremities normal, atraumatic, no cyanosis or edema Pulses: 2+ and symmetric Skin: Skin color, texture, turgor normal. No rashes or lesions Neurologic: Grossly normal Psych: Pleasant  EKG: Normal sinus rhythm at 88- personally reviewed  ASSESSMENT: Chest pain, DOE HTN Morbid obesity Family history of CAD  PLAN: 1.   [name] had a single episode of significant chest discomfort and some dyspnea on exertion.  Risk factors include hypertension and age as well as a family history of early onset heart disease.  I would like to proceed with a CT coronary angiogram to further evaluate for possible obstructive coronary disease.  She may need further cardiovascular risk reduction depending on the findings of the study.  Will premedicate with 100 mg metoprolol.  Plan follow-up with me afterwards.  Thanks again for the kind referral.  Alyssa Casino, MD, FACC, [place] Director of the [facility] Lipid [facility] &  Cardiovascular Risk Reduction Clinic Diplomate of the American Board of Clinical Lipidology Attending Cardiologist  Direct Dial: [phone]  Fax: [phone]  Website:  [email or web address] 11/20/2022, 2:38 PM

## 2022-11-24 ENCOUNTER — Other Ambulatory Visit: Payer: Self-pay | Admitting: Internal Medicine

## 2022-11-25 ENCOUNTER — Telehealth: Payer: Self-pay | Admitting: Internal Medicine

## 2022-11-25 NOTE — Telephone Encounter (Signed)
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 Requesting call back.

## 2022-11-25 NOTE — Telephone Encounter (Signed)
Returned call to patient and advised her that BMET order is in and she can have this done at any [facility]. Patient states that she will have this done today in preparation for her CCTA on 12/19. Advised patient to call back to office with any issues, questions, or concerns. Patient verbalized understanding.

## 2022-11-26 LAB — BASIC METABOLIC PANEL
BUN/Creatinine Ratio: 19 (ref 9–23)
BUN: 14 mg/dL (ref 6–24)
CO2: 26 mmol/L (ref 20–29)
Calcium: 9.5 mg/dL (ref 8.7–10.2)
Chloride: 101 mmol/L (ref 96–106)
Creatinine, Ser: 0.73 mg/dL (ref 0.57–1.00)
Glucose: 127 mg/dL — ABNORMAL HIGH (ref 70–99)
Potassium: 4.7 mmol/L (ref 3.5–5.2)
Sodium: 140 mmol/L (ref 134–144)
eGFR: 95 mL/min/{1.73_m2} (ref >59)

## 2022-11-28 ENCOUNTER — Telehealth (HOSPITAL_COMMUNITY): Payer: Self-pay | Admitting: *Deleted

## 2022-11-28 NOTE — Telephone Encounter (Signed)
Reaching out to patient to offer assistance regarding upcoming cardiac imaging study; pt verbalizes understanding of appt date/time, parking situation and where to check in, pre-test NPO status and medications ordered, and verified current allergies; name and call back number provided for further questions should they arise  Lauryl Seyer RN Navigator Cardiac Imaging [facility] [phone] office [phone] cell  Patient to take 100mg metoprolol tartrate two hours prior to her cardiac CT scan. She is aware to arrive at 2:30pm. 

## 2022-11-29 ENCOUNTER — Ambulatory Visit (HOSPITAL_COMMUNITY)
Admission: RE | Admit: 2022-11-29 | Discharge: 2022-11-29 | Disposition: A | Discharge status: Home / Self Care | Payer: Managed Care, Other (non HMO) | Source: Ambulatory Visit | Attending: Internal Medicine | Admitting: Internal Medicine

## 2022-11-29 ENCOUNTER — Other Ambulatory Visit: Payer: Self-pay | Admitting: Cardiovascular Disease

## 2022-11-29 DIAGNOSIS — R931 Abnormal findings on diagnostic imaging of heart and coronary circulation: Secondary | ICD-10-CM | POA: Insufficient documentation

## 2022-11-29 DIAGNOSIS — R0602 Shortness of breath: Secondary | ICD-10-CM | POA: Insufficient documentation

## 2022-11-29 DIAGNOSIS — I2510 Atherosclerotic heart disease of native coronary artery without angina pectoris: Secondary | ICD-10-CM

## 2022-11-29 DIAGNOSIS — R079 Chest pain, unspecified: Secondary | ICD-10-CM | POA: Diagnosis not present

## 2022-11-29 MED ORDER — NITROGLYCERIN 0.4 MG SL SUBL
SUBLINGUAL_TABLET | SUBLINGUAL | Status: AC
  Filled 2022-11-29: qty 2

## 2022-11-29 MED ORDER — METOPROLOL TARTRATE 5 MG/5ML IV SOLN
INTRAVENOUS | Status: AC
  Filled 2022-11-29: qty 5

## 2022-11-29 MED ORDER — NITROGLYCERIN 0.4 MG SL SUBL
0.8000 mg | SUBLINGUAL_TABLET | Freq: Once | SUBLINGUAL | Status: AC
  Administered 2022-11-29: 0.8 mg via SUBLINGUAL

## 2022-11-29 MED ORDER — IOHEXOL 350 MG/ML SOLN
100.0000 mL | Freq: Once | INTRAVENOUS | Status: AC | PRN
  Administered 2022-11-29: 100 mL via INTRAVENOUS

## 2022-11-30 ENCOUNTER — Ambulatory Visit (HOSPITAL_BASED_OUTPATIENT_CLINIC_OR_DEPARTMENT_OTHER)
Admission: RE | Admit: 2022-11-30 | Discharge: 2022-11-30 | Disposition: A | Discharge status: Home / Self Care | Payer: Managed Care, Other (non HMO) | Source: Ambulatory Visit | Attending: Cardiovascular Disease | Admitting: Cardiovascular Disease

## 2022-11-30 ENCOUNTER — Encounter: Payer: Self-pay | Admitting: Internal Medicine

## 2022-11-30 DIAGNOSIS — Z8249 Family history of ischemic heart disease and other diseases of the circulatory system: Secondary | ICD-10-CM

## 2022-11-30 DIAGNOSIS — R079 Chest pain, unspecified: Secondary | ICD-10-CM | POA: Diagnosis not present

## 2022-11-30 DIAGNOSIS — R931 Abnormal findings on diagnostic imaging of heart and coronary circulation: Secondary | ICD-10-CM

## 2022-11-30 DIAGNOSIS — Z79899 Other long term (current) drug therapy: Secondary | ICD-10-CM

## 2022-11-30 MED ORDER — ASPIRIN 81 MG PO TBEC: 81.0000 mg | DELAYED_RELEASE_TABLET | Freq: Every day | ORAL | 3 refills | Status: AC

## 2022-12-24 LAB — NMR, LIPOPROFILE
Cholesterol, Total: 162 mg/dL (ref 100–199)
HDL Particle Number: 38.8 umol/L (ref >=30.5)
HDL-C: 55 mg/dL (ref >39)
LDL Particle Number: 980 nmol/L (ref <1000)
LDL Size: 21.1 nm (ref >20.5)
LDL-C (NIH Calc): 87 mg/dL (ref 0–99)
LP-IR Score: 65 — ABNORMAL HIGH (ref <=45)
Small LDL Particle Number: 378 nmol/L (ref <=527)
Triglycerides: 111 mg/dL (ref 0–149)

## 2022-12-24 LAB — LIPOPROTEIN A (LPA): Lipoprotein (a): <8.4 nmol/L (ref <75.0)

## 2022-12-27 ENCOUNTER — Encounter: Payer: Self-pay | Admitting: Internal Medicine

## 2022-12-27 ENCOUNTER — Ambulatory Visit: Payer: Managed Care, Other (non HMO) | Admitting: Internal Medicine

## 2022-12-27 ENCOUNTER — Ambulatory Visit: Payer: Managed Care, Other (non HMO) | Attending: Internal Medicine | Admitting: Internal Medicine

## 2022-12-27 DIAGNOSIS — E785 Hyperlipidemia, unspecified: Secondary | ICD-10-CM

## 2022-12-27 DIAGNOSIS — I2510 Atherosclerotic heart disease of native coronary artery without angina pectoris: Secondary | ICD-10-CM | POA: Diagnosis not present

## 2022-12-27 MED ORDER — ROSUVASTATIN CALCIUM 20 MG PO TABS: 20.0000 mg | ORAL_TABLET | Freq: Every day | ORAL | 3 refills | Status: DC

## 2022-12-27 NOTE — Patient Instructions (Signed)
Medication Instructions:  START crestor '20mg'$  daily  *If you need a refill on your cardiac medications before your next appointment, please call your pharmacy*   Lab Work: FASTING NMR lipoprofile to check cholesterol in 3-4 months ** complete about 1 week before your next visit  If you have labs (blood work) drawn today and your tests are completely normal, you will receive your results only by: [place] (if you have MyChart) OR A paper copy in the mail If you have any lab test that is abnormal or we need to change your treatment, we will call you to review the results. 2 Follow-Up: At [facility], you and your health needs are our priority.  As part of our continuing mission to provide you with exceptional heart care, we have created designated Provider Care Teams.  These Care Teams include your primary Cardiologist (physician) and Advanced Practice Providers (APPs -  Physician Assistants and Nurse Practitioners) who all work together to provide you with the care you need, when you need it.  We recommend signing up for the patient portal called "MyChart".  Sign up information is provided on this After Visit Summary.  MyChart is used to connect with patients for Virtual Visits (Telemedicine).  Patients are able to view lab/test results, encounter notes, upcoming appointments, etc.  Non-urgent messages can be sent to your provider as well.   To learn more about what you can do with MyChart, go to [email or web address].    Your next appointment:    3-4 months   Provider:    Dr. Lyman Bishop -- lipid clinic

## 2022-12-27 NOTE — Progress Notes (Signed)
Virtual Visit via Video Note   Because of Alyssa Barr's co-morbid illnesses, she is at least at moderate risk for complications without adequate follow up.  This format is felt to be most appropriate for this patient at this time.  All issues noted in this document were discussed and addressed.  A limited physical exam was performed with this format.  Please refer to the patient's chart for her consent to telehealth for [facility].      Date:  12/27/2022   ID:  Alyssa Barr, DOB 10/19/1961, MRN [id number] The patient was identified using 2 identifiers.  Evaluation Performed:  Follow-Up Visit  Patient Location:  [address] [place] [address]  Provider location:   [address] [place], [place] [address]  PCP:  Maryland Pink, MD  Cardiologist:  None Electrophysiologist:  None   Chief Complaint:  Follow-up dyslipidemia  History of Present Illness:    Alyssa Barr is a 63 y.o. female who presents via audio/video conferencing for a telehealth visit today.  This is a pleasant female with a history of remote breast cancer in 2014, without recurrence and hypertension, as well as a family history of coronary artery disease in her mother, brother and high cholesterol as well.  She presents for evaluation of progressive shortness of breath with exertion.  She notes that she has been helping her husband take care of her mother-in-law who has been placed into nursing care in [facility].  She was out there visiting and had an episode of discomfort in her chest which radiated to her back and between her shoulder blades.  It was intense but seem to improve when sitting up versus lying down.  She took soft ring to try to get her to burp but had minimal relief with this.  She thought possibly it was related to altitude or it was a "fluke".  Ultimately this resolved and she has not had recurrence since she returned to [place].  She denies any significant  exertional shortness of breath and has been recently exercising.  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  Posterior margin less than 1 mm.   Mammographic microcalcification 12/31/2012   right breast   Personal history of radiation therapy     Past Surgical History:  Procedure Laterality Date   APPENDECTOMY     age of 12    BREAST BIOPSY Right 01/03/2013   stereo, low grade DCIS   BREAST EXCISIONAL BIOPSY Right 01/15/13   positive   BREAST LUMPECTOMY Right 2014   with radiation   BREAST SURGERY Right 01/10/2013   wide excision, low grade DCIS, ER/PR positive tumor. Post procedure radiation therapy to start 02/11/13   CESAREAN SECTION  0093,8182   COLONOSCOPY  09/03/2013   Byrnett   ENDOMETRIAL BIOPSY  01/07/2014   Barnett Applebaum, M.D. for endometrial prominence and postmenopausal bleeding.    FAMHx:  Family History  Problem Relation Age of Onset   Cancer Cousin 84       breast cancer in late 50's   Breast cancer Cousin    Cancer Maternal Grandmother  88       Blastomycosis of the jaw-late 2's   Cancer Paternal Grandfather 5       colon cancer in late 81's    SOCHx:   reports that she has never smoked. She has never used smokeless tobacco. She reports current alcohol use. She reports that she does not use drugs.  ALLERGIES:  Allergies  Allergen Reactions   Wasp Venom Anaphylaxis   Sulfa Antibiotics Itching, Swelling, Rash and Other (See Comments)    Reaction:  Hand swelling     MEDS:  Current Meds  Medication Sig   aspirin EC 81 MG tablet Take 1 tablet (81 mg total) by mouth daily. Swallow whole.   B Complex-C (B-COMPLEX  WITH VITAMIN C) tablet Take 1 tablet by mouth daily.   cholecalciferol (VITAMIN D) 1000 units tablet Take 2,000 Units by mouth daily.   EPINEPHrine 0.3 mg/0.3 mL IJ SOAJ injection as needed.    lisinopril-hydrochlorothiazide (PRINZIDE,ZESTORETIC) 20-25 MG per tablet Take 1 tablet by mouth daily.   magnesium oxide (MAG-OX) 400 (240 Mg) MG tablet Take 400 mg by mouth daily.     ROS: Pertinent items noted in HPI and remainder of comprehensive ROS otherwise negative.  Labs/Other Tests and Data Reviewed:    Recent Labs: 11/25/2022: BUN 14; Creatinine, Ser 0.73; Potassium 4.7; Sodium 140   Recent Lipid Panel No results found for: "CHOL", "TRIG", "HDL", "CHOLHDL", "LDLCALC", "LDLDIRECT"  Wt Readings from Last 3 Encounters:  11/18/22 248 lb 6.4 oz (112.7 kg)  01/29/19 235 lb (106.6 kg)  02/01/18 235 lb (106.6 kg)     Exam:    Vital Signs:  LMP 11/30/2012    General appearance: alert and no distress Lungs: no wheezes Abdomen: overweight Extremities: extremities normal, atraumatic, no cyanosis or edema Neurologic: Grossly normal  ASSESSMENT & PLAN:    Chest pain, DOE -coronary CT demonstrated three-vessel CAD with calcium score of 379, 98th percentile and FFR positive stenosis of the PDA.  Medical therapy was recommended (11/2022) HTN Morbid obesity Family history of CAD Negative LP(a)  Alyssa Barr was found to have multivessel coronary disease with positive FFR stenosis of the PDA however this is a small vessel and while it could potentially cause her chest pain, she has not had any further symptoms.  I advised starting aspirin.  We repeated a lipid profile which is essentially stable compared to her cholesterol back in October, demonstrating an LDL particle number of 980, LDL-C of 87, HDL-C 55 and triglycerides 111.  Small LDL particle #378.  Although her overall cholesterol numbers are not that bad, she does have significant coronary disease and would benefit from the lipid  lowering and pleiotropic benefits of statin therapy.  Guideline recommendations are for high intensity statin therapy.  Would advise starting rosuvastatin 20 mg daily.  Check repeat lipids in 3 to 4 months.  Fortunately her LP(a) is negative.  Follow-up with me at that time.  Patient Risk:   After full review of this patients clinical status, I feel that they are at least moderate risk at this time.  Time:   Today, I have spent 25 minutes with the patient with telehealth technology discussing dyslipidemia, abnormal coronary CT.     Medication Adjustments/Labs and Tests Ordered: Current medicines are reviewed at length with the patient today.  Concerns regarding medicines are outlined above.   Tests Ordered: No orders of the defined types were placed in this encounter.   Medication Changes: No orders of the defined types  were placed in this encounter.   Disposition:  in 4 month(s)  Pixie Casino, MD, [facility], [place] Director of the Advanced Lipid Disorders &  Cardiovascular Risk Reduction Clinic Diplomate of the American Board of Clinical Lipidology Attending Cardiologist  Direct Dial: [phone]  Fax: [phone]  Website:  [email or web address]  Pixie Casino, MD  12/27/2022 8:25 AM

## 2023-02-07 ENCOUNTER — Telehealth: Payer: Self-pay | Admitting: Internal Medicine

## 2023-02-07 NOTE — Telephone Encounter (Signed)
Returned call to patient and advised that order is active and in her chart already. Patient aware and verbalized understanding.

## 2023-02-07 NOTE — Telephone Encounter (Signed)
Pt is scheduled for Lipid f/u on 04/25/2023, pt requesting to get a paper order for labs. She stated she works for [employer] and she can get it done at her office for free. Please advise.

## 2023-04-19 LAB — NMR, LIPOPROFILE
Cholesterol, Total: 122 mg/dL (ref 100–199)
HDL Particle Number: 41.4 umol/L (ref >=30.5)
HDL-C: 55 mg/dL (ref >39)
LDL Particle Number: 551 nmol/L (ref <1000)
LDL Size: 20 nm — ABNORMAL LOW (ref >20.5)
LDL-C (NIH Calc): 45 mg/dL (ref 0–99)
LP-IR Score: 56 — ABNORMAL HIGH (ref <=45)
Small LDL Particle Number: 335 nmol/L (ref <=527)
Triglycerides: 124 mg/dL (ref 0–149)

## 2023-04-24 ENCOUNTER — Other Ambulatory Visit: Payer: Self-pay | Admitting: Family Medicine

## 2023-04-24 DIAGNOSIS — Z1231 Encounter for screening mammogram for malignant neoplasm of breast: Secondary | ICD-10-CM

## 2023-04-25 ENCOUNTER — Telehealth: Payer: Self-pay | Admitting: *Deleted

## 2023-04-25 ENCOUNTER — Encounter: Payer: Self-pay | Admitting: Internal Medicine

## 2023-04-25 ENCOUNTER — Ambulatory Visit: Payer: Managed Care, Other (non HMO) | Attending: Internal Medicine | Admitting: Internal Medicine

## 2023-04-25 VITALS — BP 128/70 | Ht 65.0 in | Wt 235.0 lb

## 2023-04-25 DIAGNOSIS — I2510 Atherosclerotic heart disease of native coronary artery without angina pectoris: Secondary | ICD-10-CM | POA: Diagnosis not present

## 2023-04-25 DIAGNOSIS — I10 Essential (primary) hypertension: Secondary | ICD-10-CM | POA: Diagnosis not present

## 2023-04-25 DIAGNOSIS — E785 Hyperlipidemia, unspecified: Secondary | ICD-10-CM

## 2023-04-25 NOTE — Progress Notes (Signed)
Virtual Visit via Video Note   Because of Alyssa Barr's co-morbid illnesses, she is at least at moderate risk for complications without adequate follow up.  This format is felt to be most appropriate for this patient at this time.  All issues noted in this document were discussed and addressed.  A limited physical exam was performed with this format.  Please refer to the patient's chart for her consent to telehealth for [facility].      Date:  04/25/2023   ID:  Alyssa Barr, DOB 1960-12-21, MRN [id number] The patient was identified using 2 identifiers.  Evaluation Performed:  Follow-Up Visit  Patient Location:  [address] [place] [address]  Provider location:   [address] [place], [place] [address]  PCP:  Jerl Mina, MD  Cardiologist:  None Electrophysiologist:  None   Chief Complaint:  Follow-up dyslipidemia  History of Present Illness:    Alyssa Barr is a 62 y.o. female who presents via audio/video conferencing for a telehealth visit today.  This is a pleasant female with a history of remote breast cancer in 2014, without recurrence and hypertension, as well as a family history of coronary artery disease in her mother, brother and high cholesterol as well.  She presents for evaluation of progressive shortness of breath with exertion.  She notes that she has been helping her husband take care of her mother-in-law who has been placed into nursing care in [facility].  She was out there visiting and had an episode of discomfort in her chest which radiated to her back and between her shoulder blades.  It was intense but seem to improve when sitting up versus lying down.  She took soft ring to try to get her to burp but had minimal relief with this.  She thought possibly it was related to altitude or it was a "fluke".  Ultimately this resolved and she has not had recurrence since she returned to [place].  She denies any significant  exertional shortness of breath and has been recently exercising.  04/25/2023  [name] returns today for follow-up.  She has been tolerating rosuvastatin 20 mg daily without any significant side effects.  She has had a marked improvement in her lipid profile.  LDL particle number has come down from 980->551.  LDL-C is now 45, down from 87.  The rest of her lipid profile is within normal limits and her small LDL particle number is low at 335.  He continues to exercise and work on weight loss and is not having any anginal symptoms.   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  Posterior margin less than 1 mm.   Mammographic microcalcification 12/31/2012   right breast   Personal history of radiation therapy     Past Surgical History:  Procedure Laterality Date   APPENDECTOMY     age of 71    BREAST BIOPSY Right 01/03/2013   stereo, low grade DCIS   BREAST EXCISIONAL BIOPSY Right 01/15/13   positive   BREAST LUMPECTOMY Right 2014   with radiation   BREAST SURGERY Right 01/10/2013  wide excision, low grade DCIS, ER/PR positive tumor. Post procedure radiation therapy to start 02/11/13   CESAREAN SECTION  [id number]   COLONOSCOPY  09/03/2013   Byrnett   ENDOMETRIAL BIOPSY  01/07/2014   Alyssa Barr, M.D. for endometrial prominence and postmenopausal bleeding.    FAMHx:  Family History  Problem Relation Age of Onset   Cancer Cousin 35       breast cancer in late 50's   Breast cancer Cousin    Cancer Maternal Grandmother 9       Blastomycosis of the jaw-late 80's   Cancer Paternal Grandfather 82       colon cancer in late 53's    SOCHx:   reports that she  has never smoked. She has never used smokeless tobacco. She reports current alcohol use. She reports that she does not use drugs.  ALLERGIES:  Allergies  Allergen Reactions   Wasp Venom Anaphylaxis   Sulfa Antibiotics Itching, Swelling, Rash and Other (See Comments)    Reaction:  Hand swelling     MEDS:  Current Meds  Medication Sig   aspirin EC 81 MG tablet Take 1 tablet (81 mg total) by mouth daily. Swallow whole.   B Complex-C (B-COMPLEX WITH VITAMIN C) tablet Take 1 tablet by mouth daily.   cholecalciferol (VITAMIN D) 1000 units tablet Take 2,000 Units by mouth daily.   EPINEPHrine 0.3 mg/0.3 mL IJ SOAJ injection as needed.    lisinopril-hydrochlorothiazide (PRINZIDE,ZESTORETIC) 20-25 MG per tablet Take 1 tablet by mouth daily.   magnesium oxide (MAG-OX) 400 (240 Mg) MG tablet Take 400 mg by mouth daily.   rosuvastatin (CRESTOR) 20 MG tablet Take 1 tablet (20 mg total) by mouth daily.     ROS: Pertinent items noted in HPI and remainder of comprehensive ROS otherwise negative.  Labs/Other Tests and Data Reviewed:    Recent Labs: 11/25/2022: BUN 14; Creatinine, Ser 0.73; Potassium 4.7; Sodium 140   Recent Lipid Panel No results found for: "CHOL", "TRIG", "HDL", "CHOLHDL", "LDLCALC", "LDLDIRECT"  Wt Readings from Last 3 Encounters:  04/25/23 235 lb (106.6 kg)  11/18/22 248 lb 6.4 oz (112.7 kg)  01/29/19 235 lb (106.6 kg)     Exam:    Vital Signs:  BP 128/70   Ht 5\' 5"  (1.651 m)   Wt 235 lb (106.6 kg)   LMP 11/30/2012   BMI 39.11 kg/m    General appearance: alert and no distress Lungs: no wheezes Abdomen: obese Extremities: extremities normal, atraumatic, no cyanosis or edema Neurologic: Grossly normal  ASSESSMENT & PLAN:    Chest pain, DOE -coronary CT demonstrated three-vessel CAD with calcium score of 379, 98th percentile and FFR positive stenosis of the PDA.  Medical therapy was recommended (11/2022) HTN Morbid obesity Family history of CAD Negative  LP(a)  Ms. Bua is now at target LDL less than 55 I with a very low LDL particle number and small LDL particle number.  Her LP(a) was negative.  She is optimized on high intensity rosuvastatin.  I would recommend we continue this therapy.  We may be able to adjusted lower if she has significant weight loss or other lifestyle changes.  Otherwise we will continue her current therapies and plan to repeat lipid NMR in about 6 months.  Patient Risk:   After full review of this patients clinical status, I feel that they are at least moderate risk at this time.  Time:   Today, I have spent 15 minutes with the patient with telehealth  technology discussing dyslipidemia, abnormal coronary CT.     Medication Adjustments/Labs and Tests Ordered: Current medicines are reviewed at length with the patient today.  Concerns regarding medicines are outlined above.   Tests Ordered: No orders of the defined types were placed in this encounter.   Medication Changes: No orders of the defined types were placed in this encounter.   Disposition:  in 6 month(s)  Chrystie Nose, MD, [facility], FACP  [place]  [facility]  Medical Director of the Advanced Lipid Disorders &  Cardiovascular Risk Reduction Clinic Diplomate of the American Board of Clinical Lipidology Attending Cardiologist  Direct Dial: [phone]  Fax: [phone]  Website:  [email or web address]  Chrystie Nose, MD  04/25/2023 8:07 AM

## 2023-04-25 NOTE — Patient Instructions (Signed)
Medication Instructions:  Your physician recommends that you continue on your current medications as directed. Please refer to the Current Medication list given to you today.  *If you need a refill on your cardiac medications before your next appointment, please call your pharmacy*  Follow-Up: At [facility], you and your health needs are our priority.  As part of our continuing mission to provide you with exceptional heart care, we have created designated Provider Care Teams.  These Care Teams include your primary Cardiologist (physician) and Advanced Practice Providers (APPs -  Physician Assistants and Nurse Practitioners) who all work together to provide you with the care you need, when you need it.  We recommend signing up for the patient portal called "MyChart".  Sign up information is provided on this After Visit Summary.  MyChart is used to connect with patients for Virtual Visits (Telemedicine).  Patients are able to view lab/test results, encounter notes, upcoming appointments, etc.  Non-urgent messages can be sent to your provider as well.   To learn more about what you can do with MyChart, go to [email or web address].    Your next appointment:   Dr. Hilty recommends that you schedule a follow up visit with him the in the LIPID CLINIC in 6 months. Please have fasting blood work about 1 week prior to this visit and he will review the blood work results with you at your appointment.    

## 2023-04-25 NOTE — Addendum Note (Signed)
Addended by: Johney Frame A on: 04/25/2023 08:21 AM   Modules accepted: Orders

## 2023-04-25 NOTE — Telephone Encounter (Signed)
  Patient Consent for Virtual Visit        Alyssa Barr has provided verbal consent on 04/25/2023 for a virtual visit (video or telephone).   CONSENT FOR VIRTUAL VISIT FOR:  Alyssa Barr  By participating in this virtual visit I agree to the following:  I hereby voluntarily request, consent and authorize [facility] and its employed or contracted physicians, physician assistants, nurse practitioners or other licensed health care professionals (the Practitioner), to provide me with telemedicine health care services (the "Services") as deemed necessary by the treating Practitioner. I acknowledge and consent to receive the Services by the Practitioner via telemedicine. I understand that the telemedicine visit will involve communicating with the Practitioner through live audiovisual communication technology and the disclosure of certain medical information by electronic transmission. I acknowledge that I have been given the opportunity to request an in-person assessment or other available alternative prior to the telemedicine visit and am voluntarily participating in the telemedicine visit.  I understand that I have the right to withhold or withdraw my consent to the use of telemedicine in the course of my care at any time, without affecting my right to future care or treatment, and that the Practitioner or I may terminate the telemedicine visit at any time. I understand that I have the right to inspect all information obtained and/or recorded in the course of the telemedicine visit and may receive copies of available information for a reasonable fee.  I understand that some of the potential risks of receiving the Services via telemedicine include:  Delay or interruption in medical evaluation due to technological equipment failure or disruption; Information transmitted may not be sufficient (e.g. poor resolution of images) to allow for appropriate medical decision making by the  Practitioner; and/or  In rare instances, security protocols could fail, causing a breach of personal health information.  Furthermore, I acknowledge that it is my responsibility to provide information about my medical history, conditions and care that is complete and accurate to the best of my ability. I acknowledge that Practitioner's advice, recommendations, and/or decision may be based on factors not within their control, such as incomplete or inaccurate data provided by me or distortions of diagnostic images or specimens that may result from electronic transmissions. I understand that the practice of medicine is not an exact science and that Practitioner makes no warranties or guarantees regarding treatment outcomes. I acknowledge that a copy of this consent can be made available to me via my patient portal [facility] MyChart), or I can request a printed copy by calling the office of [facility].    I understand that my insurance will be billed for this visit.   I have read or had this consent read to me. I understand the contents of this consent, which adequately explains the benefits and risks of the Services being provided via telemedicine.  I have been provided ample opportunity to ask questions regarding this consent and the Services and have had my questions answered to my satisfaction. I give my informed consent for the services to be provided through the use of telemedicine in my medical care

## 2023-04-27 ENCOUNTER — Telehealth: Payer: Self-pay | Admitting: Internal Medicine

## 2023-04-27 NOTE — Telephone Encounter (Signed)
Patient requests for the lab orders for her appointment in November to be released to [facility] because she gets free labs there.

## 2023-04-27 NOTE — Telephone Encounter (Signed)
Patient wanted to ensure that when her lab orders are placed that she will be able to have them drawn at [facility]. Informed patient that all labs ordered goes to [facility]. She verbalized understanding.

## 2023-05-11 ENCOUNTER — Ambulatory Visit
Admission: RE | Admit: 2023-05-11 | Discharge: 2023-05-11 | Disposition: A | Discharge status: Home / Self Care | Payer: Managed Care, Other (non HMO) | Source: Ambulatory Visit | Attending: Family Medicine | Admitting: Family Medicine

## 2023-05-11 DIAGNOSIS — Z1231 Encounter for screening mammogram for malignant neoplasm of breast: Secondary | ICD-10-CM | POA: Diagnosis not present

## 2023-09-06 ENCOUNTER — Other Ambulatory Visit: Payer: Self-pay | Admitting: *Deleted

## 2023-09-06 DIAGNOSIS — E785 Hyperlipidemia, unspecified: Secondary | ICD-10-CM

## 2023-09-06 DIAGNOSIS — I2510 Atherosclerotic heart disease of native coronary artery without angina pectoris: Secondary | ICD-10-CM

## 2023-09-24 ENCOUNTER — Other Ambulatory Visit: Payer: Self-pay | Admitting: Internal Medicine

## 2023-10-26 LAB — NMR, LIPOPROFILE
Cholesterol, Total: 123 mg/dL (ref 100–199)
HDL Particle Number: 45.9 umol/L (ref >=30.5)
HDL-C: 65 mg/dL (ref >39)
LDL Particle Number: 471 nmol/L (ref <1000)
LDL Size: 19.8 nmol — ABNORMAL LOW (ref >20.5)
LDL-C (NIH Calc): 43 mg/dL (ref 0–99)
LP-IR Score: 42 (ref <=45)
Small LDL Particle Number: 284 nmol/L (ref <=527)
Triglycerides: 78 mg/dL (ref 0–149)

## 2023-11-03 ENCOUNTER — Encounter: Payer: Self-pay | Admitting: Internal Medicine

## 2023-11-03 ENCOUNTER — Ambulatory Visit: Payer: Managed Care, Other (non HMO) | Attending: Cardiovascular Disease | Admitting: Internal Medicine

## 2023-11-03 VITALS — BP 118/70 | HR 75 | Wt 237.0 lb

## 2023-11-03 DIAGNOSIS — I2510 Atherosclerotic heart disease of native coronary artery without angina pectoris: Secondary | ICD-10-CM | POA: Diagnosis not present

## 2023-11-03 DIAGNOSIS — E785 Hyperlipidemia, unspecified: Secondary | ICD-10-CM

## 2023-11-03 DIAGNOSIS — E669 Obesity, unspecified: Secondary | ICD-10-CM | POA: Diagnosis not present

## 2023-11-03 DIAGNOSIS — I10 Essential (primary) hypertension: Secondary | ICD-10-CM | POA: Diagnosis not present

## 2023-11-03 NOTE — Progress Notes (Signed)
Virtual Visit via Video Note   Because of Alyssa Barr's co-morbid illnesses, she is at least at moderate risk for complications without adequate follow up.  This format is felt to be most appropriate for this patient at this time.  All issues noted in this document were discussed and addressed.  A limited physical exam was performed with this format.  Please refer to the patient's chart for her consent to telehealth for [facility].      Date:  11/03/2023   ID:  Alyssa Barr, DOB July 28, 1961, MRN [id number] The patient was identified using 2 identifiers.  Evaluation Performed:  Follow-Up Visit  Patient Location:  [address] [place] [address]  Provider location:   [address], [address] [place], [place] [address]  PCP:  Jerl Mina, MD  Cardiologist:  None Electrophysiologist:  None   Chief Complaint:  Follow-up dyslipidemia  History of Present Illness:    Alyssa Barr is a 62 y.o. female who presents via audio/video conferencing for a telehealth visit today.  This is a pleasant female with a history of remote breast cancer in 2014, without recurrence and hypertension, as well as a family history of coronary artery disease in her mother, brother and high cholesterol as well.  She presents for evaluation of progressive shortness of breath with exertion.  She notes that she has been helping her husband take care of her mother-in-law who has been placed into nursing care in [facility].  She was out there visiting and had an episode of discomfort in her chest which radiated to her back and between her shoulder blades.  It was intense but seem to improve when sitting up versus lying down.  She took soft ring to try to get her to burp but had minimal relief with this.  She thought possibly it was related to altitude or it was a "fluke".  Ultimately this resolved and she has not had recurrence since she returned to [place].  She denies any significant  exertional shortness of breath and has been recently exercising.  04/25/2023  Alyssa Barr returns today for follow-up.  She has been tolerating rosuvastatin 20 mg daily without any significant side effects.  She has had a marked improvement in her lipid profile.  LDL particle number has come down from 980->551.  LDL-C is now 45, down from 87.  The rest of her lipid profile is within normal limits and her small LDL particle number is low at 335.  He continues to exercise and work on weight loss and is not having any anginal symptoms.  11/03/2023  Alyssa Barr is seen today in follow-up.  Her cholesterol continues to be well-controlled.  Her particle numbers are even lower now at 471 (down from 551), LDL 43, triglycerides greatly improved at 78 (down from 124) and HDL 65.  She has been exercising a lot more and continues to eat well.  Overall these changes are likely attributable to diet and lifestyle.  Despite this she has only had minimal weight loss.  She inquired today about what she might be candidate for the GLP-1 agonists or similar medications for weight loss.   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  Posterior margin less than 1 mm.   Mammographic microcalcification 12/31/2012   right breast   Personal history of radiation therapy     Past Surgical History:  Procedure Laterality Date   APPENDECTOMY     age of 63    BREAST BIOPSY Right 01/03/2013   stereo, low grade DCIS   BREAST EXCISIONAL BIOPSY Right 01/15/13   positive   BREAST LUMPECTOMY Right 2014   with radiation   BREAST SURGERY Right 01/10/2013   wide excision, low grade DCIS, ER/PR positive tumor.  Post procedure radiation therapy to start 02/11/13   CESAREAN SECTION  4098,1191   COLONOSCOPY  09/03/2013   Byrnett   ENDOMETRIAL BIOPSY  01/07/2014   Annamarie Major, M.D. for endometrial prominence and postmenopausal bleeding.    FAMHx:  Family History  Problem Relation Age of Onset   Cancer Cousin 40       breast cancer in late 50's   Breast cancer Cousin    Cancer Maternal Grandmother 80       Blastomycosis of the jaw-late 80's   Cancer Paternal Grandfather 38       colon cancer in late 40's    SOCHx:   reports that she has never smoked. She has never used smokeless tobacco. She reports current alcohol use. She reports that she does not use drugs.  ALLERGIES:  Allergies  Allergen Reactions   Wasp Venom Anaphylaxis   Sulfa Antibiotics Itching, Swelling, Rash and Other (See Comments)    Reaction:  Hand swelling     MEDS:  Current Meds  Medication Sig   aspirin EC 81 MG tablet Take 1 tablet (81 mg total) by mouth daily. Swallow whole.   B Complex-C (B-COMPLEX WITH VITAMIN C) tablet Take 1 tablet by mouth daily.   cholecalciferol (VITAMIN D) 1000 units tablet Take 2,000 Units by mouth daily.   CINNAMON PO Take 1 capsule by mouth daily.   EPINEPHrine 0.3 mg/0.3 mL IJ SOAJ injection as needed.    Glucosamine-Chondroitin (GLUCOSAMINE CHONDR COMPLEX PO) Take by mouth daily.   lisinopril-hydrochlorothiazide (PRINZIDE,ZESTORETIC) 20-25 MG per tablet Take 1 tablet by mouth daily.   magnesium oxide (MAG-OX) 400 (240 Mg) MG tablet Take 400 mg by mouth daily.   rosuvastatin (CRESTOR) 20 MG tablet TAKE 1 TABLET(20 MG) BY MOUTH DAILY     ROS: Pertinent items noted in HPI and remainder of comprehensive ROS otherwise negative.  Labs/Other Tests and Data Reviewed:    Recent Labs: 11/25/2022: BUN 14; Creatinine, Ser 0.73; Potassium 4.7; Sodium 140   Recent Lipid Panel No results found for: "CHOL", "TRIG", "HDL", "CHOLHDL", "LDLCALC", "LDLDIRECT"  Wt Readings from Last 3  Encounters:  11/03/23 237 lb (107.5 kg)  04/25/23 235 lb (106.6 kg)  11/18/22 248 lb 6.4 oz (112.7 kg)     Exam:    Vital Signs:  BP 118/70   Pulse 75   Wt 237 lb (107.5 kg)   LMP 11/30/2012   BMI 39.44 kg/m    General appearance: alert and no distress Lungs: no wheezes Abdomen: obese Extremities: extremities normal, atraumatic, no cyanosis or edema Neurologic: Grossly normal  ASSESSMENT & PLAN:    Chest pain, DOE -coronary CT demonstrated three-vessel CAD with calcium score of 379, 98th percentile and FFR positive stenosis of the PDA.  Medical therapy was recommended (11/2022) HTN Morbid obesity Family history of CAD Negative LP(a)  Alyssa Barr has had further significant reduction  in her lipids mostly attributable to diet and lifestyle measures on top of her statin.  She is doing well without chest pain or shortness of breath.  She continues to swim and exercise regularly.  Unfortunately she is still having trouble losing weight.  BMI now 39 and she was hopeful to see if she might benefit from one of the GLP-1 agonist.  I think that Reginal Lutes may be a great option since she has documented coronary artery disease and class III obesity.  Will refer her to our Pharm.D. weight management clinic to see if we may be able to get her on this medication.  Plan otherwise follow-up with me annually or sooner as necessary.  Patient Risk:   After full review of this patients clinical status, I feel that they are at least moderate risk at this time.  Time:   Today, I have spent 15 minutes with the patient with telehealth technology discussing dyslipidemia, abnormal coronary CT.     Medication Adjustments/Labs and Tests Ordered: Current medicines are reviewed at length with the patient today.  Concerns regarding medicines are outlined above.   Tests Ordered: Orders Placed This Encounter  Procedures   NMR, lipoprofile   AMB Referral to [facility]    Medication Changes: No  orders of the defined types were placed in this encounter.   Disposition:  in 1 year(s)  Chrystie Nose, MD, [facility], FACP  [place]  [facility]  Medical Director of the Advanced Lipid Disorders &  Cardiovascular Risk Reduction Clinic Diplomate of the American Board of Clinical Lipidology Attending Cardiologist  Direct Dial: [phone]  Fax: [phone]  Website:  [email or web address]  Chrystie Nose, MD  11/03/2023 8:18 AM

## 2023-11-03 NOTE — Patient Instructions (Signed)
Medication Instructions:  NO CHANGES  *If you need a refill on your cardiac medications before your next appointment, please call your pharmacy*   Lab Work: FASTING NMR lipoprofile in 1 year  If you have labs (blood work) drawn today and your tests are completely normal, you will receive your results only by: MyChart Message (if you have MyChart) OR A paper copy in the mail If you have any lab test that is abnormal or we need to change your treatment, we will call you to review the results.   Testing/Procedures: NONE   Follow-Up: At [facility], you and your health needs are our priority.  As part of our continuing mission to provide you with exceptional heart care, we have created designated Provider Care Teams.  These Care Teams include your primary Cardiologist (physician) and Advanced Practice Providers (APPs -  Physician Assistants and Nurse Practitioners) who all work together to provide you with the care you need, when you need it.  We recommend signing up for the patient portal called "MyChart".  Sign up information is provided on this After Visit Summary.  MyChart is used to connect with patients for Virtual Visits (Telemedicine).  Patients are able to view lab/test results, encounter notes, upcoming appointments, etc.  Non-urgent messages can be sent to your provider as well.   To learn more about what you can do with MyChart, go to [email or web address].    Your next appointment:   1 year with Dr. Rennis Golden -- lipid clinic Other Instructions You have been referred to our [facility] clinical pharmacy team to discuss weight management medications - GLP-1. You'll get a call to schedule a consultation. Our office is located at: [address], [place] [address]

## 2023-12-27 LAB — EXTERNAL GENERIC LAB PROCEDURE: COLOGUARD: POSITIVE — AB

## 2023-12-27 LAB — COLOGUARD: COLOGUARD: POSITIVE — AB

## 2024-01-17 ENCOUNTER — Ambulatory Visit: Payer: Managed Care, Other (non HMO)

## 2024-02-14 ENCOUNTER — Ambulatory Visit: Admitting: Anesthesiology

## 2024-02-14 ENCOUNTER — Encounter
Admission: RE | Disposition: A | Discharge status: Home / Self Care | Payer: Self-pay | Source: Home / Self Care | Attending: Surgery

## 2024-02-14 ENCOUNTER — Encounter: Payer: Self-pay | Admitting: Surgery

## 2024-02-14 ENCOUNTER — Ambulatory Visit
Admission: RE | Admit: 2024-02-14 | Discharge: 2024-02-14 | Disposition: A | Discharge status: Home / Self Care | Payer: Self-pay | Attending: Surgery | Admitting: Surgery

## 2024-02-14 DIAGNOSIS — Z7982 Long term (current) use of aspirin: Secondary | ICD-10-CM | POA: Diagnosis not present

## 2024-02-14 DIAGNOSIS — Z79899 Other long term (current) drug therapy: Secondary | ICD-10-CM | POA: Diagnosis not present

## 2024-02-14 DIAGNOSIS — I2510 Atherosclerotic heart disease of native coronary artery without angina pectoris: Secondary | ICD-10-CM | POA: Insufficient documentation

## 2024-02-14 DIAGNOSIS — K5730 Diverticulosis of large intestine without perforation or abscess without bleeding: Secondary | ICD-10-CM | POA: Insufficient documentation

## 2024-02-14 DIAGNOSIS — Z833 Family history of diabetes mellitus: Secondary | ICD-10-CM | POA: Insufficient documentation

## 2024-02-14 DIAGNOSIS — G4730 Sleep apnea, unspecified: Secondary | ICD-10-CM | POA: Diagnosis not present

## 2024-02-14 DIAGNOSIS — K644 Residual hemorrhoidal skin tags: Secondary | ICD-10-CM | POA: Diagnosis not present

## 2024-02-14 DIAGNOSIS — Z853 Personal history of malignant neoplasm of breast: Secondary | ICD-10-CM | POA: Diagnosis not present

## 2024-02-14 DIAGNOSIS — I10 Essential (primary) hypertension: Secondary | ICD-10-CM | POA: Diagnosis not present

## 2024-02-14 DIAGNOSIS — R195 Other fecal abnormalities: Secondary | ICD-10-CM | POA: Diagnosis not present

## 2024-02-14 DIAGNOSIS — K640 First degree hemorrhoids: Secondary | ICD-10-CM | POA: Insufficient documentation

## 2024-02-14 DIAGNOSIS — Z1211 Encounter for screening for malignant neoplasm of colon: Secondary | ICD-10-CM | POA: Insufficient documentation

## 2024-02-14 HISTORY — PX: COLONOSCOPY WITH PROPOFOL: SHX5780

## 2024-02-14 SURGERY — COLONOSCOPY WITH PROPOFOL
Anesthesia: General | Site: Buttocks

## 2024-02-14 MED ORDER — PROPOFOL 10 MG/ML IV BOLUS
INTRAVENOUS | Status: AC
  Filled 2024-02-14: qty 40

## 2024-02-14 MED ORDER — SODIUM CHLORIDE 0.9 % IV SOLN: INTRAVENOUS | Status: DC

## 2024-02-14 MED ORDER — PROPOFOL 10 MG/ML IV BOLUS
INTRAVENOUS | Status: DC | PRN
  Administered 2024-02-14: 120 mg via INTRAVENOUS
  Administered 2024-02-14: 30 mg via INTRAVENOUS
  Administered 2024-02-14: 50 mg via INTRAVENOUS
  Administered 2024-02-14: 40 mg via INTRAVENOUS
  Administered 2024-02-14: 30 mg via INTRAVENOUS
  Administered 2024-02-14: 100 mg via INTRAVENOUS
  Administered 2024-02-14: 30 mg via INTRAVENOUS

## 2024-02-14 MED ORDER — LIDOCAINE HCL (CARDIAC) PF 100 MG/5ML IV SOSY
PREFILLED_SYRINGE | INTRAVENOUS | Status: DC | PRN
  Administered 2024-02-14: 50 mg via INTRAVENOUS

## 2024-02-14 MED ORDER — LIDOCAINE HCL (PF) 2 % IJ SOLN
INTRAMUSCULAR | Status: AC
  Filled 2024-02-14: qty 5

## 2024-02-14 NOTE — Anesthesia Preprocedure Evaluation (Signed)
 Anesthesia Evaluation  Patient identified by MRN, date of birth, ID band Patient awake    Reviewed: Allergy & Precautions, NPO status , Patient's Chart, lab work & pertinent test results  History of Anesthesia Complications Negative for: history of anesthetic complications  Airway Mallampati: III  TM Distance: <3 FB Neck ROM: full    Dental  (+) Chipped   Pulmonary neg shortness of breath, sleep apnea    Pulmonary exam normal        Cardiovascular Exercise Tolerance: Good hypertension, (-) angina + CAD  (-) Past MI Normal cardiovascular exam     Neuro/Psych negative neurological ROS  negative psych ROS   GI/Hepatic negative GI ROS, Neg liver ROS,neg GERD  ,,  Endo/Other  negative endocrine ROS    Renal/GU negative Renal ROS  negative genitourinary   Musculoskeletal   Abdominal   Peds  Hematology negative hematology ROS (+)   Anesthesia Other Findings Past Medical History: 2014: Breast cancer (HCC)     Comment:  radiation- Right 2012: Hypertension 01/10/2013: Malignant neoplasm of upper-outer quadrant of female  breast (HCC)     Comment:  Right breast, DCIS, grade 1. ER 90%, PR 60%; whole               breast radiation ending May 2014.  Posterior margin less               than 1 mm. 12/31/2012: Mammographic microcalcification     Comment:  right breast No date: Personal history of radiation therapy  Past Surgical History: No date: APPENDECTOMY     Comment:  age of 84  01/03/2013: BREAST BIOPSY; Right     Comment:  stereo, low grade DCIS 01/15/13: BREAST EXCISIONAL BIOPSY; Right     Comment:  positive 2014: BREAST LUMPECTOMY; Right     Comment:  with radiation 01/10/2013: BREAST SURGERY; Right     Comment:  wide excision, low grade DCIS, ER/PR positive tumor.               Post procedure radiation therapy to start 02/11/13 5284,1324: CESAREAN SECTION 09/03/2013: COLONOSCOPY     Comment:   Byrnett 01/07/2014: ENDOMETRIAL BIOPSY     Comment:  Annamarie Major, M.D. for endometrial prominence and               postmenopausal bleeding.  BMI    Body Mass Index: 39.27 kg/m      Reproductive/Obstetrics negative OB ROS                             Anesthesia Physical Anesthesia Plan  ASA: 3  Anesthesia Plan: General   Post-op Pain Management:    Induction: Intravenous  PONV Risk Score and Plan: Propofol infusion and TIVA  Airway Management Planned: Natural Airway and Nasal Cannula  Additional Equipment:   Intra-op Plan:   Post-operative Plan:   Informed Consent: I have reviewed the patients History and Physical, chart, labs and discussed the procedure including the risks, benefits and alternatives for the proposed anesthesia with the patient or authorized representative who has indicated his/her understanding and acceptance.     Dental Advisory Given  Plan Discussed with: Anesthesiologist, CRNA and Surgeon  Anesthesia Plan Comments: (Patient consented for risks of anesthesia including but not limited to:  - adverse reactions to medications - risk of airway placement if required - damage to eyes, teeth, lips or other oral mucosa - nerve damage due to positioning  -  sore throat or hoarseness - Damage to heart, brain, nerves, lungs, other parts of body or loss of life  Patient voiced understanding and assent.)       Anesthesia Quick Evaluation

## 2024-02-14 NOTE — H&P (Signed)
 Subjective:   CC: Positive colorectal cancer screening using Cologuard test [R19.5]  HPI: Alyssa Barr is a 63 y.o. female who presents for evaluation of above. Asymptomatic. Previous cscope in 2014 negative, recommended 64yr f/u  Past Medical History: has a past medical history of Breast cancer (CMS/HHS-HCC) (01/10/2013), Hypertension (2012), Personal history of radiation therapy, and Vitamin D deficiency.  Past Surgical History: has a past surgical history that includes Appendectomy; Cesarean section (1986); Exploratory laparotomy (12/12/1988); endometrial biopsy (01/07/2014); Colonoscopy (09/03/2013); Breast excisional biopsy (Right, 01/03/2013); Mastectomy partial / lumpectomy (Right, 01/10/2013); and Cesarean section (1991).  Family History: family history includes Asthma in her brother; Breast cancer in her cousin; COPD in her brother, father, and mother; Cancer in her maternal grandmother; Colon cancer in her paternal grandfather; Coronary Artery Disease (Blocked arteries around heart) in her brother and mother; Diabetes type II in her brother; Heart failure in her father; High blood pressure (Hypertension) in her brother, father, and mother; Osteoarthritis in her brother; Osteoporosis (Thinning of bones) in her mother.  Social History: reports that she has never smoked. She has never used smokeless tobacco. She reports that she does not currently use alcohol. She reports that she does not use drugs.  Current Medications: has a current medication list which includes the following prescription(s): aspirin, cholecalciferol, epinephrine, hydrocodone-acetaminophen, lisinopril-hydrochlorothiazide, magnesium, metaxalone, metaxalone, rosuvastatin, b-complex with vitamin c, and ondansetron.  Allergies:  Allergies  Allergen Reactions  Wasp Venom Anaphylaxis  Sulfa (Sulfonamide Antibiotics) Other (See Comments), Itching, Rash and Swelling  Bilateral hand redness/itching Reaction: Hand swelling    ROS:  A 15 point review of systems was performed and pertinent positives and negatives noted in HPI  Objective:    BP 136/78  Pulse 88  Ht 165.1 cm (5\' 5" )  Wt (!) 101.6 kg (224 lb)  BMI 37.28 kg/m   Constitutional : No distress, cooperative, alert  Lymphatics/Throat: Supple with no lymphadenopathy  Respiratory: Clear to auscultation bilaterally  Cardiovascular: Regular rate and rhythm  Gastrointestinal: Soft, non-tender, non-distended, no organomegaly.  Musculoskeletal: Steady gait and movement  Skin: Cool and moist  Psychiatric: Normal affect, non-agitated, not confused    LABS:  N/a  RADS: N/a  Assessment:    Positive colorectal cancer screening using Cologuard test [R19.5]  Plan:    Will proceed with colonoscopy. R/b/a discussed. Risks include bleeding, perforation. Benefits include diagnostic, curative procedure if needed. Alternatives include continued observation. Pt verbalized understanding.  labs/images/medications/previous chart entries reviewed personally and relevant changes/updates noted above.

## 2024-02-14 NOTE — Op Note (Signed)
 [facility] Patient Name: Alyssa Barr Procedure Date: 02/14/2024 8:34 AM MRN: [id number] Account #: [id number] Date of Birth: 1961/03/25 Admit Type: Outpatient Age: 63 Room: [facility] ENDO ROOM 1 Gender: Female Note Status: Finalized Instrument Name: Nelda Marseille [id number] Procedure:             Colonoscopy Indications:           Screening for colorectal malignant neoplasm due to                         positive Cologuard test Providers:             Sung Amabile MD, MD Medicines:             Propofol per Anesthesia Complications:         No immediate complications. Procedure:             Pre-Anesthesia Assessment:                        - After reviewing the risks and benefits, the patient                         was deemed in satisfactory condition to undergo the                         procedure in an ambulatory setting.                        After obtaining informed consent, the colonoscope was                         passed under direct vision. Throughout the procedure,                         the patient's blood pressure, pulse, and oxygen                         saturations were monitored continuously. The                         Colonoscope was introduced through the anus and                         advanced to the the cecum, identified by the ileocecal                         valve. The colonoscopy was performed without                         difficulty. The patient tolerated the procedure well.                         The quality of the bowel preparation was good. Findings:      Skin tags were found on perianal exam.      A single medium-mouthed diverticulum was found in the sigmoid colon.      Non-bleeding internal hemorrhoids were found during retroflexion. The       hemorrhoids were Grade I (internal hemorrhoids that do not prolapse).      The exam was otherwise without abnormality.  Impression:            - Perianal skin tags found on  perianal exam.                        - Diverticulosis in the sigmoid colon.                        - Non-bleeding internal hemorrhoids.                        - The examination was otherwise normal.                        - No specimens collected. Recommendation:        - Discharge patient to home.                        - Resume previous diet.                        - Written discharge instructions were provided to the                         patient.                        - Repeat colonoscopy in 7-10 years for screening                         purposes. Procedure Code(s):     --- Professional ---                        [phone], Colonoscopy, flexible; diagnostic, including                         collection of specimen(s) by brushing or washing, when                         performed (separate procedure) Diagnosis Code(s):     --- Professional ---                        Z12.11, Encounter for screening for malignant neoplasm                         of colon                        R19.5, Other fecal abnormalities                        K64.0, First degree hemorrhoids                        K64.4, Residual hemorrhoidal skin tags                        K57.30, Diverticulosis of large intestine without                         perforation or abscess without bleeding CPT copyright 2022 American Medical Association. All rights reserved. The codes documented in this  report are preliminary and upon coder review may  be revised to meet current compliance requirements. Dr. Harrie Foreman, MD Sung Amabile MD, MD 02/14/2024 8:59:34 AM This report has been signed electronically. Number of Addenda: 0 Note Initiated On: 02/14/2024 8:34 AM Scope Withdrawal Time: 0 hours 7 minutes 24 seconds  Total Procedure Duration: 0 hours 16 minutes 12 seconds  Estimated Blood Loss:  Estimated blood loss: none.      [facility]

## 2024-02-14 NOTE — Transfer of Care (Signed)
 Immediate Anesthesia Transfer of Care Note  Patient: Alyssa Barr  Procedure(s) Performed: COLONOSCOPY WITH PROPOFOL (Buttocks)  Patient Location: Endoscopy Unit  Anesthesia Type:General  Level of Consciousness: awake, alert , and oriented  Airway & Oxygen Therapy: Patient Spontanous Breathing and Patient connected to nasal cannula oxygen  Post-op Assessment: Report given to RN, Post -op Vital signs reviewed and stable, and Patient moving all extremities  Post vital signs: Reviewed and stable  Last Vitals:  Vitals Value Taken Time  BP 159/75 02/14/24 0857  Temp    Pulse 91 02/14/24 0858  Resp 13 02/14/24 0858  SpO2 99 % 02/14/24 0858  Vitals shown include unfiled device data.  Last Pain:  Vitals:   02/14/24 0857  TempSrc:   PainSc: 0-No pain         Complications: No notable events documented.

## 2024-02-14 NOTE — Anesthesia Postprocedure Evaluation (Signed)
 Anesthesia Post Note  Patient: Alyssa Barr  Procedure(s) Performed: COLONOSCOPY WITH PROPOFOL (Buttocks)  Patient location during evaluation: Endoscopy Anesthesia Type: General Level of consciousness: awake and alert Pain management: pain level controlled Vital Signs Assessment: post-procedure vital signs reviewed and stable Respiratory status: spontaneous breathing, nonlabored ventilation, respiratory function stable and patient connected to nasal cannula oxygen Cardiovascular status: blood pressure returned to baseline and stable Postop Assessment: no apparent nausea or vomiting Anesthetic complications: no   No notable events documented.   Last Vitals:  Vitals:   02/14/24 0857 02/14/24 0907  BP: (!) 159/75 (!) 176/83  Pulse: 93 84  Resp: 17 19  Temp:    SpO2: 99% 100%    Last Pain:  Vitals:   02/14/24 0907  TempSrc:   PainSc: 0-No pain                 Cleda Mccreedy Shivaun Bilello

## 2024-02-15 ENCOUNTER — Encounter: Payer: Self-pay | Admitting: Surgery

## 2024-06-26 ENCOUNTER — Other Ambulatory Visit: Payer: Self-pay | Admitting: Obstetrics and Gynecology

## 2024-06-26 DIAGNOSIS — Z1231 Encounter for screening mammogram for malignant neoplasm of breast: Secondary | ICD-10-CM

## 2024-07-08 ENCOUNTER — Ambulatory Visit
Admission: RE | Admit: 2024-07-08 | Discharge: 2024-07-08 | Disposition: A | Discharge status: Home / Self Care | Source: Ambulatory Visit | Attending: Obstetrics and Gynecology | Admitting: Obstetrics and Gynecology

## 2024-07-08 DIAGNOSIS — Z1231 Encounter for screening mammogram for malignant neoplasm of breast: Secondary | ICD-10-CM | POA: Diagnosis present

## 2024-09-04 ENCOUNTER — Other Ambulatory Visit: Payer: Self-pay | Admitting: *Deleted

## 2024-09-04 DIAGNOSIS — E785 Hyperlipidemia, unspecified: Secondary | ICD-10-CM

## 2024-09-05 ENCOUNTER — Encounter: Payer: Self-pay | Admitting: Internal Medicine

## 2024-11-05 ENCOUNTER — Other Ambulatory Visit: Payer: Self-pay | Admitting: Internal Medicine

## 2024-11-13 LAB — NMR, LIPOPROFILE

## 2024-11-18 ENCOUNTER — Ambulatory Visit: Payer: Self-pay | Admitting: Internal Medicine

## 2024-11-20 ENCOUNTER — Other Ambulatory Visit: Payer: Self-pay | Admitting: *Deleted

## 2024-11-20 DIAGNOSIS — E785 Hyperlipidemia, unspecified: Secondary | ICD-10-CM

## 2024-11-22 ENCOUNTER — Encounter: Payer: Self-pay | Admitting: Internal Medicine

## 2024-11-22 ENCOUNTER — Ambulatory Visit: Attending: Internal Medicine | Admitting: Internal Medicine

## 2024-11-22 VITALS — BP 118/60 | HR 96 | Ht 65.0 in | Wt 237.0 lb

## 2024-11-22 DIAGNOSIS — I10 Essential (primary) hypertension: Secondary | ICD-10-CM

## 2024-11-22 DIAGNOSIS — I2510 Atherosclerotic heart disease of native coronary artery without angina pectoris: Secondary | ICD-10-CM

## 2024-11-22 DIAGNOSIS — E785 Hyperlipidemia, unspecified: Secondary | ICD-10-CM | POA: Diagnosis not present

## 2024-11-22 DIAGNOSIS — E669 Obesity, unspecified: Secondary | ICD-10-CM | POA: Diagnosis not present

## 2024-11-22 NOTE — Patient Instructions (Signed)
 Medication Instructions:  NO CHANGES  *If you need a refill on your cardiac medications before your next appointment, please call your pharmacy*  Lab Work: FASTING lab work to check cholesterol  If you have labs (blood work) drawn today and your tests are completely normal, you will receive your results only by: MyChart Message (if you have MyChart) OR A paper copy in the mail If you have any lab test that is abnormal or we need to change your treatment, we will call you to review the results.  Follow-Up: At [facility], you and your health needs are our priority.  As part of our continuing mission to provide you with exceptional heart care, our providers are all part of one team.  This team includes your primary Cardiologist (physician) and Advanced Practice Providers or APPs (Physician Assistants and Nurse Practitioners) who all work together to provide you with the care you need, when you need it.  Your next appointment:    12 months with Dr. Mona  We recommend signing up for the patient portal called MyChart.  Sign up information is provided on this After Visit Summary.  MyChart is used to connect with patients for Virtual Visits (Telemedicine).  Patients are able to view lab/test results, encounter notes, upcoming appointments, etc.  Non-urgent messages can be sent to your provider as well.   To learn more about what you can do with MyChart, go to [email or web address].   Other Instructions

## 2024-11-22 NOTE — Progress Notes (Signed)
 LIPID NOTE  Chief Complaint:  No complaint  Primary Care Physician: Valora Lynwood FALCON, MD  Primary Cardiologist:  None  HPI:  Alyssa Barr is a 63 y.o. female who has a history of remote breast cancer in 2014, without recurrence and hypertension, as well as a family history of coronary artery disease in her mother, brother and high cholesterol as well.  She presents for evaluation of progressive shortness of breath with exertion.  She notes that she has been helping her husband take care of her mother-in-law who has been placed into nursing care in [facility] .  She was out there visiting and had an episode of discomfort in her chest which radiated to her back and between her shoulder blades.  It was intense but seem to improve when sitting up versus lying down.  She took soft ring to try to get her to burp but had minimal relief with this.  She thought possibly it was related to altitude or it was a fluke.  Ultimately this resolved and she has not had recurrence since she returned to [place].  She denies any significant exertional shortness of breath and has been recently exercising.  04/25/2023  Alyssa Barr returns today for follow-up.  She has been tolerating rosuvastatin  20 mg daily without any significant side effects.  She has had a marked improvement in her lipid profile.  LDL particle number has come down from 980->551.  LDL-C is now 45, down from 87.  The rest of her lipid profile is within normal limits and her small LDL particle number is low at 335.  He continues to exercise and work on weight loss and is not having any anginal symptoms.  11/03/2023  Alyssa Barr is seen today in follow-up.  Her cholesterol continues to be well-controlled.  Her particle numbers are even lower now at 471 (down from 551), LDL 43, triglycerides greatly improved at 78 (down from 124) and HDL 65.  She has been exercising a lot more and continues to eat well.  Overall these changes are likely  attributable to diet and lifestyle.  Despite this she has only had minimal weight loss.  She inquired today about what she might be candidate for the GLP-1 agonists or similar medications for weight loss.  11/22/2024  Alyssa Barr returns today for follow-up.  She says she feels well denies any chest pain or worsening shortness of breath.  She did have labs performed through [employer] where she works however they were lost and she needs to have them redrawn.  Blood pressure is well-controlled today.  Weight has not improved.  She was not able to get on a GLP-1 because of cost issues.  She reports being under a lot of stress taking care of her husband with some medical problems and through work.  She says she gets some occasional shortness of breath with extreme exertion which was lifting multiple cords of logs since her husband has been able to do that.  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  Posterior margin less than 1 mm.   Mammographic microcalcification 12/31/2012   right breast   Personal history of radiation therapy     Past Surgical History:  Procedure Laterality Date   APPENDECTOMY  age of 70    BREAST BIOPSY Right 01/03/2013   stereo, low grade DCIS   BREAST EXCISIONAL BIOPSY Right 01/15/13   positive   BREAST LUMPECTOMY Right 2014   with radiation   BREAST SURGERY Right 01/10/2013   wide excision, low grade DCIS, ER/PR positive tumor. Post procedure radiation therapy to start 02/11/13   CESAREAN SECTION  8013,8008   COLONOSCOPY  09/03/2013   [place]   COLONOSCOPY WITH PROPOFOL  N/A 02/14/2024   Procedure: COLONOSCOPY WITH PROPOFOL ;  Surgeon: Tye Millet, DO;  Location: [facility] ENDOSCOPY;  Service: General;  Laterality: N/A;   ENDOMETRIAL BIOPSY   01/07/2014   Deward Lesches, M.D. for endometrial prominence and postmenopausal bleeding.    FAMHx:  Family History  Problem Relation Age of Onset   Cancer Cousin 2       breast cancer in late 50's   Breast cancer Cousin    Cancer Maternal Grandmother 74       Blastomycosis of the jaw-late 80's   Cancer Paternal Grandfather 76       colon cancer in late 62's    SOCHx:   reports that she has never smoked. She has never used smokeless tobacco. She reports current alcohol use. She reports that she does not use drugs.  ALLERGIES:  Allergies[1]  ROS: Pertinent items noted in HPI and remainder of comprehensive ROS otherwise negative.  HOME MEDS: Medications Ordered Prior to Encounter[2]  LABS/IMAGING: No results found for this or any previous visit (from the past 48 hours). No results found.  LIPID PANEL: No results found for: CHOL, TRIG, HDL, CHOLHDL, VLDL, LDLCALC, LDLDIRECT  Lipoprotein (a)  Date/Time Value Ref Range Status  12/22/2022 07:29 AM <8.4 <75.0 nmol/L Final    Comment:    **Results verified by repeat testing** Note:  Values greater than or equal to 75.0 nmol/L may        indicate an independent risk factor for CHD,        but must be evaluated with caution when applied        to non-Caucasian populations due to the        influence of genetic factors on Lp(a) across        ethnicities.       WEIGHTS: Wt Readings from Last 3 Encounters:  11/22/24 237 lb (107.5 kg)  02/14/24 236 lb (107 kg)  11/03/23 237 lb (107.5 kg)    VITALS: BP 118/60   Pulse 96   Ht 5' 5 (1.651 m)   Wt 237 lb (107.5 kg)   LMP 11/30/2012   SpO2 96%   BMI 39.44 kg/m   EXAM: General appearance: alert and no distress Lungs: clear to auscultation bilaterally Heart: regular rate and rhythm, S1, S2 normal, no murmur, click, rub or gallop Extremities: extremities normal, atraumatic, no cyanosis or edema Neurologic: Grossly  normal  EKG: Deferred  ASSESSMENT: Chest pain, DOE -coronary CT demonstrated three-vessel CAD with calcium  score of 379, 98th percentile and FFR positive stenosis of the PDA.  Medical therapy was recommended (11/2022) HTN Morbid obesity Family history of CAD Negative LP(a)  PLAN: 1.   Alyssa Barr denies any chest pain but notes some shortness of breath with extreme exertion.  I think this may be just due to heavy exercise and obesity.  Otherwise she is asymptomatic.  Blood pressure is well-controlled.  She did have recent repeat lipids but they were lost.  Will plan to continue her current therapies.  Plan follow-up annually or sooner  as necessary.  Alyssa KYM Maxcy, MD, [facility], FNLA, FACP  [place]  [facility]  Medical Director of the [facility]  [facility] Diplomate of the American Board of Clinical Lipidology Attending Cardiologist  Direct Dial: [phone]  Fax: [phone]  Website:  [email or web address]  Alyssa Barr 11/22/2024, 10:16 AM    [1]  Allergies Allergen Reactions   Wasp Venom Anaphylaxis   Sulfa Antibiotics Itching, Swelling, Rash and Other (See Comments)    Reaction:  Hand swelling   [2]  Current Outpatient Medications on File Prior to Visit  Medication Sig Dispense Refill   aspirin  EC 81 MG tablet Take 1 tablet (81 mg total) by mouth daily. Swallow whole. 90 tablet 3   B Complex-C (B-COMPLEX WITH VITAMIN C) tablet Take 1 tablet by mouth daily.     cholecalciferol (VITAMIN D) 1000 units tablet Take 2,000 Units by mouth daily.     CINNAMON PO Take 1 capsule by mouth daily.     EPINEPHrine  0.3 mg/0.3 mL IJ SOAJ injection as needed.      lisinopril-hydrochlorothiazide (PRINZIDE,ZESTORETIC) 20-25 MG per tablet Take 1 tablet by mouth daily.     magnesium oxide (MAG-OX) 400 (240 Mg) MG tablet Take 400 mg by mouth daily.     rosuvastatin  (CRESTOR ) 20 MG tablet TAKE 1 TABLET(20 MG) BY MOUTH DAILY 90 tablet 0    Glucosamine-Chondroitin (GLUCOSAMINE CHONDR COMPLEX PO) Take by mouth daily.     No current facility-administered medications on file prior to visit.
